# Patient Record
Sex: Female | Born: 1940 | Race: White | Hispanic: No | Marital: Married | Smoking: Former smoker
Health system: Southern US, Community
[De-identification: ages and names within clinical notes are randomized; demographics above are authoritative.]

## PROBLEM LIST (undated history)

## (undated) DIAGNOSIS — E119 Type 2 diabetes mellitus without complications: Secondary | ICD-10-CM

## (undated) DIAGNOSIS — R42 Dizziness and giddiness: Secondary | ICD-10-CM

## (undated) DIAGNOSIS — I10 Essential (primary) hypertension: Secondary | ICD-10-CM

## (undated) DIAGNOSIS — K219 Gastro-esophageal reflux disease without esophagitis: Secondary | ICD-10-CM

## (undated) DIAGNOSIS — C801 Malignant (primary) neoplasm, unspecified: Secondary | ICD-10-CM

## (undated) DIAGNOSIS — E039 Hypothyroidism, unspecified: Secondary | ICD-10-CM

---

## 1981-06-22 HISTORY — PX: REDUCTION MAMMAPLASTY: SUR839

## 2004-08-05 ENCOUNTER — Inpatient Hospital Stay: Payer: Self-pay | Admitting: Internal Medicine

## 2005-05-05 ENCOUNTER — Ambulatory Visit: Payer: Self-pay | Admitting: Internal Medicine

## 2006-05-11 ENCOUNTER — Ambulatory Visit: Payer: Self-pay | Admitting: Internal Medicine

## 2007-05-18 ENCOUNTER — Ambulatory Visit: Payer: Self-pay | Admitting: Internal Medicine

## 2007-07-18 ENCOUNTER — Ambulatory Visit: Payer: Self-pay | Admitting: Gastroenterology

## 2008-02-09 ENCOUNTER — Ambulatory Visit: Payer: Self-pay | Admitting: Gastroenterology

## 2008-04-16 ENCOUNTER — Ambulatory Visit: Payer: Self-pay | Admitting: Gastroenterology

## 2008-05-22 ENCOUNTER — Ambulatory Visit: Payer: Self-pay | Admitting: Internal Medicine

## 2009-05-31 ENCOUNTER — Ambulatory Visit: Payer: Self-pay | Admitting: Internal Medicine

## 2010-06-11 ENCOUNTER — Ambulatory Visit: Payer: Self-pay | Admitting: Internal Medicine

## 2011-07-07 ENCOUNTER — Ambulatory Visit: Payer: Self-pay | Admitting: Internal Medicine

## 2011-12-03 ENCOUNTER — Ambulatory Visit: Payer: Self-pay

## 2012-08-09 ENCOUNTER — Ambulatory Visit: Payer: Self-pay | Admitting: Internal Medicine

## 2013-08-10 ENCOUNTER — Ambulatory Visit: Payer: Self-pay | Admitting: Internal Medicine

## 2013-08-20 ENCOUNTER — Emergency Department: Payer: Self-pay | Admitting: Emergency Medicine

## 2014-06-27 DIAGNOSIS — M4806 Spinal stenosis, lumbar region: Secondary | ICD-10-CM | POA: Diagnosis not present

## 2014-06-27 DIAGNOSIS — M5416 Radiculopathy, lumbar region: Secondary | ICD-10-CM | POA: Diagnosis not present

## 2014-07-09 DIAGNOSIS — H40053 Ocular hypertension, bilateral: Secondary | ICD-10-CM | POA: Diagnosis not present

## 2014-07-30 DIAGNOSIS — M4806 Spinal stenosis, lumbar region: Secondary | ICD-10-CM | POA: Diagnosis not present

## 2014-07-30 DIAGNOSIS — M5416 Radiculopathy, lumbar region: Secondary | ICD-10-CM | POA: Diagnosis not present

## 2014-08-13 ENCOUNTER — Ambulatory Visit: Payer: Self-pay | Admitting: Internal Medicine

## 2014-08-13 DIAGNOSIS — Z1231 Encounter for screening mammogram for malignant neoplasm of breast: Secondary | ICD-10-CM | POA: Diagnosis not present

## 2014-09-07 DIAGNOSIS — M545 Low back pain: Secondary | ICD-10-CM | POA: Diagnosis not present

## 2014-09-07 DIAGNOSIS — F329 Major depressive disorder, single episode, unspecified: Secondary | ICD-10-CM | POA: Diagnosis not present

## 2014-09-07 DIAGNOSIS — R739 Hyperglycemia, unspecified: Secondary | ICD-10-CM | POA: Diagnosis not present

## 2014-09-07 DIAGNOSIS — E785 Hyperlipidemia, unspecified: Secondary | ICD-10-CM | POA: Diagnosis not present

## 2014-09-12 DIAGNOSIS — D2271 Melanocytic nevi of right lower limb, including hip: Secondary | ICD-10-CM | POA: Diagnosis not present

## 2014-09-12 DIAGNOSIS — Z872 Personal history of diseases of the skin and subcutaneous tissue: Secondary | ICD-10-CM | POA: Diagnosis not present

## 2014-09-12 DIAGNOSIS — Z1283 Encounter for screening for malignant neoplasm of skin: Secondary | ICD-10-CM | POA: Diagnosis not present

## 2014-09-12 DIAGNOSIS — D485 Neoplasm of uncertain behavior of skin: Secondary | ICD-10-CM | POA: Diagnosis not present

## 2014-09-12 DIAGNOSIS — D2221 Melanocytic nevi of right ear and external auricular canal: Secondary | ICD-10-CM | POA: Diagnosis not present

## 2014-09-12 DIAGNOSIS — L718 Other rosacea: Secondary | ICD-10-CM | POA: Diagnosis not present

## 2014-09-26 DIAGNOSIS — E784 Other hyperlipidemia: Secondary | ICD-10-CM | POA: Diagnosis not present

## 2014-09-26 DIAGNOSIS — I10 Essential (primary) hypertension: Secondary | ICD-10-CM | POA: Diagnosis not present

## 2014-09-26 DIAGNOSIS — Z0001 Encounter for general adult medical examination with abnormal findings: Secondary | ICD-10-CM | POA: Diagnosis not present

## 2014-09-26 DIAGNOSIS — K219 Gastro-esophageal reflux disease without esophagitis: Secondary | ICD-10-CM | POA: Diagnosis not present

## 2014-09-26 DIAGNOSIS — R739 Hyperglycemia, unspecified: Secondary | ICD-10-CM | POA: Diagnosis not present

## 2014-09-27 DIAGNOSIS — H04563 Stenosis of bilateral lacrimal punctum: Secondary | ICD-10-CM | POA: Diagnosis not present

## 2014-10-16 DIAGNOSIS — M898X9 Other specified disorders of bone, unspecified site: Secondary | ICD-10-CM | POA: Diagnosis not present

## 2014-10-16 DIAGNOSIS — M79674 Pain in right toe(s): Secondary | ICD-10-CM | POA: Diagnosis not present

## 2014-12-13 DIAGNOSIS — H04563 Stenosis of bilateral lacrimal punctum: Secondary | ICD-10-CM | POA: Diagnosis not present

## 2014-12-21 DIAGNOSIS — I10 Essential (primary) hypertension: Secondary | ICD-10-CM | POA: Diagnosis not present

## 2014-12-21 DIAGNOSIS — K219 Gastro-esophageal reflux disease without esophagitis: Secondary | ICD-10-CM | POA: Diagnosis not present

## 2014-12-21 DIAGNOSIS — R739 Hyperglycemia, unspecified: Secondary | ICD-10-CM | POA: Diagnosis not present

## 2014-12-28 DIAGNOSIS — K219 Gastro-esophageal reflux disease without esophagitis: Secondary | ICD-10-CM | POA: Diagnosis not present

## 2014-12-28 DIAGNOSIS — E038 Other specified hypothyroidism: Secondary | ICD-10-CM | POA: Diagnosis not present

## 2014-12-28 DIAGNOSIS — R739 Hyperglycemia, unspecified: Secondary | ICD-10-CM | POA: Diagnosis not present

## 2014-12-28 DIAGNOSIS — E784 Other hyperlipidemia: Secondary | ICD-10-CM | POA: Diagnosis not present

## 2015-03-27 DIAGNOSIS — E119 Type 2 diabetes mellitus without complications: Secondary | ICD-10-CM | POA: Diagnosis not present

## 2015-03-27 DIAGNOSIS — M545 Low back pain: Secondary | ICD-10-CM | POA: Diagnosis not present

## 2015-03-27 DIAGNOSIS — G8929 Other chronic pain: Secondary | ICD-10-CM | POA: Diagnosis not present

## 2015-03-27 DIAGNOSIS — E034 Atrophy of thyroid (acquired): Secondary | ICD-10-CM | POA: Diagnosis not present

## 2015-03-29 DIAGNOSIS — G8929 Other chronic pain: Secondary | ICD-10-CM | POA: Diagnosis not present

## 2015-03-29 DIAGNOSIS — E034 Atrophy of thyroid (acquired): Secondary | ICD-10-CM | POA: Diagnosis not present

## 2015-03-29 DIAGNOSIS — E119 Type 2 diabetes mellitus without complications: Secondary | ICD-10-CM | POA: Diagnosis not present

## 2015-03-29 DIAGNOSIS — M545 Low back pain: Secondary | ICD-10-CM | POA: Diagnosis not present

## 2015-04-03 DIAGNOSIS — K219 Gastro-esophageal reflux disease without esophagitis: Secondary | ICD-10-CM | POA: Diagnosis not present

## 2015-04-03 DIAGNOSIS — Z23 Encounter for immunization: Secondary | ICD-10-CM | POA: Diagnosis not present

## 2015-04-03 DIAGNOSIS — E119 Type 2 diabetes mellitus without complications: Secondary | ICD-10-CM | POA: Diagnosis not present

## 2015-04-03 DIAGNOSIS — E784 Other hyperlipidemia: Secondary | ICD-10-CM | POA: Diagnosis not present

## 2015-04-03 DIAGNOSIS — F3342 Major depressive disorder, recurrent, in full remission: Secondary | ICD-10-CM | POA: Diagnosis not present

## 2015-04-03 DIAGNOSIS — E034 Atrophy of thyroid (acquired): Secondary | ICD-10-CM | POA: Diagnosis not present

## 2015-04-03 DIAGNOSIS — I10 Essential (primary) hypertension: Secondary | ICD-10-CM | POA: Diagnosis not present

## 2015-04-03 DIAGNOSIS — M545 Low back pain: Secondary | ICD-10-CM | POA: Diagnosis not present

## 2015-04-10 DIAGNOSIS — L570 Actinic keratosis: Secondary | ICD-10-CM | POA: Diagnosis not present

## 2015-04-10 DIAGNOSIS — L821 Other seborrheic keratosis: Secondary | ICD-10-CM | POA: Diagnosis not present

## 2015-04-11 DIAGNOSIS — H04563 Stenosis of bilateral lacrimal punctum: Secondary | ICD-10-CM | POA: Diagnosis not present

## 2015-05-22 DIAGNOSIS — M4806 Spinal stenosis, lumbar region: Secondary | ICD-10-CM | POA: Diagnosis not present

## 2015-05-22 DIAGNOSIS — M5416 Radiculopathy, lumbar region: Secondary | ICD-10-CM | POA: Diagnosis not present

## 2015-06-04 DIAGNOSIS — M4806 Spinal stenosis, lumbar region: Secondary | ICD-10-CM | POA: Diagnosis not present

## 2015-06-04 DIAGNOSIS — M5416 Radiculopathy, lumbar region: Secondary | ICD-10-CM | POA: Diagnosis not present

## 2015-09-16 DIAGNOSIS — I10 Essential (primary) hypertension: Secondary | ICD-10-CM | POA: Diagnosis not present

## 2015-09-16 DIAGNOSIS — E119 Type 2 diabetes mellitus without complications: Secondary | ICD-10-CM | POA: Diagnosis not present

## 2015-09-16 DIAGNOSIS — R42 Dizziness and giddiness: Secondary | ICD-10-CM | POA: Diagnosis not present

## 2015-09-16 DIAGNOSIS — M4806 Spinal stenosis, lumbar region: Secondary | ICD-10-CM | POA: Diagnosis not present

## 2015-09-17 ENCOUNTER — Other Ambulatory Visit: Payer: Self-pay | Admitting: Internal Medicine

## 2015-09-17 DIAGNOSIS — R42 Dizziness and giddiness: Secondary | ICD-10-CM

## 2015-09-20 ENCOUNTER — Ambulatory Visit: Payer: Self-pay

## 2015-09-25 ENCOUNTER — Ambulatory Visit
Admission: RE | Admit: 2015-09-25 | Discharge: 2015-09-25 | Disposition: A | Discharge status: Home / Self Care | Payer: Commercial Managed Care - HMO | Source: Ambulatory Visit | Attending: Internal Medicine | Admitting: Internal Medicine

## 2015-09-25 ENCOUNTER — Ambulatory Visit: Payer: Self-pay

## 2015-09-25 DIAGNOSIS — R42 Dizziness and giddiness: Secondary | ICD-10-CM | POA: Insufficient documentation

## 2015-09-25 DIAGNOSIS — I10 Essential (primary) hypertension: Secondary | ICD-10-CM | POA: Diagnosis not present

## 2015-09-26 DIAGNOSIS — M545 Low back pain: Secondary | ICD-10-CM | POA: Diagnosis not present

## 2015-09-26 DIAGNOSIS — I10 Essential (primary) hypertension: Secondary | ICD-10-CM | POA: Diagnosis not present

## 2015-09-26 DIAGNOSIS — G8929 Other chronic pain: Secondary | ICD-10-CM | POA: Diagnosis not present

## 2015-09-26 DIAGNOSIS — E784 Other hyperlipidemia: Secondary | ICD-10-CM | POA: Diagnosis not present

## 2015-09-26 DIAGNOSIS — E119 Type 2 diabetes mellitus without complications: Secondary | ICD-10-CM | POA: Diagnosis not present

## 2015-09-26 DIAGNOSIS — K219 Gastro-esophageal reflux disease without esophagitis: Secondary | ICD-10-CM | POA: Diagnosis not present

## 2015-10-11 DIAGNOSIS — D329 Benign neoplasm of meninges, unspecified: Secondary | ICD-10-CM | POA: Diagnosis not present

## 2015-10-11 DIAGNOSIS — E034 Atrophy of thyroid (acquired): Secondary | ICD-10-CM | POA: Diagnosis not present

## 2015-10-11 DIAGNOSIS — E119 Type 2 diabetes mellitus without complications: Secondary | ICD-10-CM | POA: Diagnosis not present

## 2015-10-11 DIAGNOSIS — K219 Gastro-esophageal reflux disease without esophagitis: Secondary | ICD-10-CM | POA: Diagnosis not present

## 2015-10-11 DIAGNOSIS — Z1239 Encounter for other screening for malignant neoplasm of breast: Secondary | ICD-10-CM | POA: Diagnosis not present

## 2015-10-11 DIAGNOSIS — I10 Essential (primary) hypertension: Secondary | ICD-10-CM | POA: Diagnosis not present

## 2015-10-11 DIAGNOSIS — E784 Other hyperlipidemia: Secondary | ICD-10-CM | POA: Diagnosis not present

## 2015-10-11 DIAGNOSIS — M545 Low back pain: Secondary | ICD-10-CM | POA: Diagnosis not present

## 2015-10-11 DIAGNOSIS — F3342 Major depressive disorder, recurrent, in full remission: Secondary | ICD-10-CM | POA: Diagnosis not present

## 2015-10-11 DIAGNOSIS — Z0000 Encounter for general adult medical examination without abnormal findings: Secondary | ICD-10-CM | POA: Diagnosis not present

## 2015-11-20 DIAGNOSIS — M4806 Spinal stenosis, lumbar region: Secondary | ICD-10-CM | POA: Diagnosis not present

## 2015-11-20 DIAGNOSIS — M5416 Radiculopathy, lumbar region: Secondary | ICD-10-CM | POA: Diagnosis not present

## 2015-11-25 DIAGNOSIS — H40053 Ocular hypertension, bilateral: Secondary | ICD-10-CM | POA: Diagnosis not present

## 2015-12-26 DIAGNOSIS — M5416 Radiculopathy, lumbar region: Secondary | ICD-10-CM | POA: Diagnosis not present

## 2015-12-26 DIAGNOSIS — M4806 Spinal stenosis, lumbar region: Secondary | ICD-10-CM | POA: Diagnosis not present

## 2016-01-14 ENCOUNTER — Other Ambulatory Visit: Payer: Self-pay | Admitting: Physical Medicine and Rehabilitation

## 2016-01-14 DIAGNOSIS — M5416 Radiculopathy, lumbar region: Secondary | ICD-10-CM

## 2016-01-24 ENCOUNTER — Ambulatory Visit
Admission: RE | Admit: 2016-01-24 | Discharge: 2016-01-24 | Disposition: A | Discharge status: Home / Self Care | Payer: Commercial Managed Care - HMO | Source: Ambulatory Visit | Attending: Physical Medicine and Rehabilitation | Admitting: Physical Medicine and Rehabilitation

## 2016-01-24 DIAGNOSIS — M5116 Intervertebral disc disorders with radiculopathy, lumbar region: Secondary | ICD-10-CM | POA: Insufficient documentation

## 2016-01-24 DIAGNOSIS — M5136 Other intervertebral disc degeneration, lumbar region: Secondary | ICD-10-CM | POA: Diagnosis not present

## 2016-01-24 DIAGNOSIS — M5416 Radiculopathy, lumbar region: Secondary | ICD-10-CM | POA: Diagnosis not present

## 2016-01-24 DIAGNOSIS — Z9889 Other specified postprocedural states: Secondary | ICD-10-CM | POA: Insufficient documentation

## 2016-01-28 DIAGNOSIS — M5136 Other intervertebral disc degeneration, lumbar region: Secondary | ICD-10-CM | POA: Diagnosis not present

## 2016-01-28 DIAGNOSIS — M5416 Radiculopathy, lumbar region: Secondary | ICD-10-CM | POA: Diagnosis not present

## 2016-01-28 DIAGNOSIS — M4806 Spinal stenosis, lumbar region: Secondary | ICD-10-CM | POA: Diagnosis not present

## 2016-02-07 DIAGNOSIS — M5136 Other intervertebral disc degeneration, lumbar region: Secondary | ICD-10-CM | POA: Diagnosis not present

## 2016-02-07 DIAGNOSIS — M5416 Radiculopathy, lumbar region: Secondary | ICD-10-CM | POA: Diagnosis not present

## 2016-02-07 DIAGNOSIS — M4806 Spinal stenosis, lumbar region: Secondary | ICD-10-CM | POA: Diagnosis not present

## 2016-04-07 DIAGNOSIS — K219 Gastro-esophageal reflux disease without esophagitis: Secondary | ICD-10-CM | POA: Diagnosis not present

## 2016-04-07 DIAGNOSIS — D329 Benign neoplasm of meninges, unspecified: Secondary | ICD-10-CM | POA: Diagnosis not present

## 2016-04-07 DIAGNOSIS — I10 Essential (primary) hypertension: Secondary | ICD-10-CM | POA: Diagnosis not present

## 2016-04-07 DIAGNOSIS — E119 Type 2 diabetes mellitus without complications: Secondary | ICD-10-CM | POA: Diagnosis not present

## 2016-04-14 DIAGNOSIS — M545 Low back pain: Secondary | ICD-10-CM | POA: Diagnosis not present

## 2016-04-14 DIAGNOSIS — E119 Type 2 diabetes mellitus without complications: Secondary | ICD-10-CM | POA: Diagnosis not present

## 2016-04-14 DIAGNOSIS — E034 Atrophy of thyroid (acquired): Secondary | ICD-10-CM | POA: Diagnosis not present

## 2016-04-14 DIAGNOSIS — D329 Benign neoplasm of meninges, unspecified: Secondary | ICD-10-CM | POA: Diagnosis not present

## 2016-04-14 DIAGNOSIS — E784 Other hyperlipidemia: Secondary | ICD-10-CM | POA: Diagnosis not present

## 2016-04-14 DIAGNOSIS — I10 Essential (primary) hypertension: Secondary | ICD-10-CM | POA: Diagnosis not present

## 2016-04-14 DIAGNOSIS — F3342 Major depressive disorder, recurrent, in full remission: Secondary | ICD-10-CM | POA: Diagnosis not present

## 2016-04-14 DIAGNOSIS — Z23 Encounter for immunization: Secondary | ICD-10-CM | POA: Diagnosis not present

## 2016-04-14 DIAGNOSIS — K219 Gastro-esophageal reflux disease without esophagitis: Secondary | ICD-10-CM | POA: Diagnosis not present

## 2016-05-01 DIAGNOSIS — M5416 Radiculopathy, lumbar region: Secondary | ICD-10-CM | POA: Diagnosis not present

## 2016-05-01 DIAGNOSIS — M5136 Other intervertebral disc degeneration, lumbar region: Secondary | ICD-10-CM | POA: Diagnosis not present

## 2016-05-01 DIAGNOSIS — M48062 Spinal stenosis, lumbar region with neurogenic claudication: Secondary | ICD-10-CM | POA: Diagnosis not present

## 2016-05-12 DIAGNOSIS — H10523 Angular blepharoconjunctivitis, bilateral: Secondary | ICD-10-CM | POA: Diagnosis not present

## 2016-07-07 DIAGNOSIS — H40002 Preglaucoma, unspecified, left eye: Secondary | ICD-10-CM | POA: Diagnosis not present

## 2016-07-14 ENCOUNTER — Other Ambulatory Visit: Payer: Self-pay | Admitting: Internal Medicine

## 2016-07-14 DIAGNOSIS — H40053 Ocular hypertension, bilateral: Secondary | ICD-10-CM | POA: Diagnosis not present

## 2016-07-14 DIAGNOSIS — Z1231 Encounter for screening mammogram for malignant neoplasm of breast: Secondary | ICD-10-CM

## 2016-08-13 ENCOUNTER — Encounter (HOSPITAL_COMMUNITY): Payer: Self-pay

## 2016-08-13 ENCOUNTER — Ambulatory Visit
Admission: RE | Admit: 2016-08-13 | Discharge: 2016-08-13 | Disposition: A | Discharge status: Home / Self Care | Payer: Commercial Managed Care - HMO | Source: Ambulatory Visit | Attending: Internal Medicine | Admitting: Internal Medicine

## 2016-08-13 DIAGNOSIS — Z1231 Encounter for screening mammogram for malignant neoplasm of breast: Secondary | ICD-10-CM | POA: Diagnosis not present

## 2016-08-13 HISTORY — DX: Malignant (primary) neoplasm, unspecified: C80.1

## 2016-09-16 ENCOUNTER — Other Ambulatory Visit: Payer: Self-pay | Admitting: Internal Medicine

## 2016-09-16 DIAGNOSIS — R42 Dizziness and giddiness: Secondary | ICD-10-CM

## 2016-10-05 ENCOUNTER — Ambulatory Visit
Admission: RE | Admit: 2016-10-05 | Discharge: 2016-10-05 | Disposition: A | Discharge status: Home / Self Care | Payer: Medicare HMO | Source: Ambulatory Visit | Attending: Internal Medicine | Admitting: Internal Medicine

## 2016-10-05 DIAGNOSIS — K219 Gastro-esophageal reflux disease without esophagitis: Secondary | ICD-10-CM | POA: Diagnosis not present

## 2016-10-05 DIAGNOSIS — R42 Dizziness and giddiness: Secondary | ICD-10-CM | POA: Insufficient documentation

## 2016-10-05 DIAGNOSIS — E119 Type 2 diabetes mellitus without complications: Secondary | ICD-10-CM | POA: Diagnosis not present

## 2016-10-05 DIAGNOSIS — G8929 Other chronic pain: Secondary | ICD-10-CM | POA: Diagnosis not present

## 2016-10-05 DIAGNOSIS — D320 Benign neoplasm of cerebral meninges: Secondary | ICD-10-CM | POA: Diagnosis not present

## 2016-10-05 DIAGNOSIS — I10 Essential (primary) hypertension: Secondary | ICD-10-CM | POA: Diagnosis not present

## 2016-10-05 DIAGNOSIS — D329 Benign neoplasm of meninges, unspecified: Secondary | ICD-10-CM | POA: Diagnosis not present

## 2016-10-05 DIAGNOSIS — M545 Low back pain: Secondary | ICD-10-CM | POA: Diagnosis not present

## 2016-10-12 DIAGNOSIS — E784 Other hyperlipidemia: Secondary | ICD-10-CM | POA: Diagnosis not present

## 2016-10-12 DIAGNOSIS — D1803 Hemangioma of intra-abdominal structures: Secondary | ICD-10-CM | POA: Diagnosis not present

## 2016-10-12 DIAGNOSIS — Z0000 Encounter for general adult medical examination without abnormal findings: Secondary | ICD-10-CM | POA: Diagnosis not present

## 2016-10-12 DIAGNOSIS — K219 Gastro-esophageal reflux disease without esophagitis: Secondary | ICD-10-CM | POA: Diagnosis not present

## 2016-10-12 DIAGNOSIS — M5416 Radiculopathy, lumbar region: Secondary | ICD-10-CM | POA: Diagnosis not present

## 2016-10-12 DIAGNOSIS — M48062 Spinal stenosis, lumbar region with neurogenic claudication: Secondary | ICD-10-CM | POA: Diagnosis not present

## 2016-10-12 DIAGNOSIS — E034 Atrophy of thyroid (acquired): Secondary | ICD-10-CM | POA: Diagnosis not present

## 2016-10-12 DIAGNOSIS — I10 Essential (primary) hypertension: Secondary | ICD-10-CM | POA: Diagnosis not present

## 2016-10-12 DIAGNOSIS — E119 Type 2 diabetes mellitus without complications: Secondary | ICD-10-CM | POA: Diagnosis not present

## 2016-10-12 DIAGNOSIS — D329 Benign neoplasm of meninges, unspecified: Secondary | ICD-10-CM | POA: Diagnosis not present

## 2016-11-03 DIAGNOSIS — R079 Chest pain, unspecified: Secondary | ICD-10-CM | POA: Diagnosis not present

## 2016-11-27 DIAGNOSIS — M48062 Spinal stenosis, lumbar region with neurogenic claudication: Secondary | ICD-10-CM | POA: Diagnosis not present

## 2016-11-27 DIAGNOSIS — M5416 Radiculopathy, lumbar region: Secondary | ICD-10-CM | POA: Diagnosis not present

## 2016-11-27 DIAGNOSIS — M5136 Other intervertebral disc degeneration, lumbar region: Secondary | ICD-10-CM | POA: Diagnosis not present

## 2017-01-05 DIAGNOSIS — H40002 Preglaucoma, unspecified, left eye: Secondary | ICD-10-CM | POA: Diagnosis not present

## 2017-01-12 DIAGNOSIS — H40003 Preglaucoma, unspecified, bilateral: Secondary | ICD-10-CM | POA: Diagnosis not present

## 2017-01-20 DIAGNOSIS — M5136 Other intervertebral disc degeneration, lumbar region: Secondary | ICD-10-CM | POA: Diagnosis not present

## 2017-01-20 DIAGNOSIS — M5416 Radiculopathy, lumbar region: Secondary | ICD-10-CM | POA: Diagnosis not present

## 2017-01-20 DIAGNOSIS — M48062 Spinal stenosis, lumbar region with neurogenic claudication: Secondary | ICD-10-CM | POA: Diagnosis not present

## 2017-03-31 DIAGNOSIS — M79674 Pain in right toe(s): Secondary | ICD-10-CM | POA: Diagnosis not present

## 2017-03-31 DIAGNOSIS — M898X9 Other specified disorders of bone, unspecified site: Secondary | ICD-10-CM | POA: Diagnosis not present

## 2017-04-08 DIAGNOSIS — D1803 Hemangioma of intra-abdominal structures: Secondary | ICD-10-CM | POA: Diagnosis not present

## 2017-04-08 DIAGNOSIS — I10 Essential (primary) hypertension: Secondary | ICD-10-CM | POA: Diagnosis not present

## 2017-04-08 DIAGNOSIS — E119 Type 2 diabetes mellitus without complications: Secondary | ICD-10-CM | POA: Diagnosis not present

## 2017-04-08 DIAGNOSIS — K219 Gastro-esophageal reflux disease without esophagitis: Secondary | ICD-10-CM | POA: Diagnosis not present

## 2017-04-15 DIAGNOSIS — D1803 Hemangioma of intra-abdominal structures: Secondary | ICD-10-CM | POA: Diagnosis not present

## 2017-04-15 DIAGNOSIS — K219 Gastro-esophageal reflux disease without esophagitis: Secondary | ICD-10-CM | POA: Diagnosis not present

## 2017-04-15 DIAGNOSIS — E119 Type 2 diabetes mellitus without complications: Secondary | ICD-10-CM | POA: Diagnosis not present

## 2017-04-15 DIAGNOSIS — M5416 Radiculopathy, lumbar region: Secondary | ICD-10-CM | POA: Diagnosis not present

## 2017-04-15 DIAGNOSIS — E7849 Other hyperlipidemia: Secondary | ICD-10-CM | POA: Diagnosis not present

## 2017-04-15 DIAGNOSIS — I10 Essential (primary) hypertension: Secondary | ICD-10-CM | POA: Diagnosis not present

## 2017-04-15 DIAGNOSIS — D329 Benign neoplasm of meninges, unspecified: Secondary | ICD-10-CM | POA: Diagnosis not present

## 2017-04-15 DIAGNOSIS — Z23 Encounter for immunization: Secondary | ICD-10-CM | POA: Diagnosis not present

## 2017-04-15 DIAGNOSIS — E034 Atrophy of thyroid (acquired): Secondary | ICD-10-CM | POA: Diagnosis not present

## 2017-04-22 DIAGNOSIS — M48062 Spinal stenosis, lumbar region with neurogenic claudication: Secondary | ICD-10-CM | POA: Diagnosis not present

## 2017-04-22 DIAGNOSIS — M5416 Radiculopathy, lumbar region: Secondary | ICD-10-CM | POA: Diagnosis not present

## 2017-04-22 DIAGNOSIS — M5136 Other intervertebral disc degeneration, lumbar region: Secondary | ICD-10-CM | POA: Diagnosis not present

## 2017-06-24 DIAGNOSIS — I781 Nevus, non-neoplastic: Secondary | ICD-10-CM | POA: Diagnosis not present

## 2017-06-24 DIAGNOSIS — Z86018 Personal history of other benign neoplasm: Secondary | ICD-10-CM | POA: Diagnosis not present

## 2017-06-24 DIAGNOSIS — L821 Other seborrheic keratosis: Secondary | ICD-10-CM | POA: Diagnosis not present

## 2017-06-24 DIAGNOSIS — Z872 Personal history of diseases of the skin and subcutaneous tissue: Secondary | ICD-10-CM | POA: Diagnosis not present

## 2017-06-24 DIAGNOSIS — Z1283 Encounter for screening for malignant neoplasm of skin: Secondary | ICD-10-CM | POA: Diagnosis not present

## 2017-06-24 DIAGNOSIS — L570 Actinic keratosis: Secondary | ICD-10-CM | POA: Diagnosis not present

## 2017-06-24 DIAGNOSIS — L578 Other skin changes due to chronic exposure to nonionizing radiation: Secondary | ICD-10-CM | POA: Diagnosis not present

## 2017-06-24 DIAGNOSIS — Z85828 Personal history of other malignant neoplasm of skin: Secondary | ICD-10-CM | POA: Diagnosis not present

## 2017-07-14 DIAGNOSIS — M5136 Other intervertebral disc degeneration, lumbar region: Secondary | ICD-10-CM | POA: Diagnosis not present

## 2017-07-14 DIAGNOSIS — M5416 Radiculopathy, lumbar region: Secondary | ICD-10-CM | POA: Diagnosis not present

## 2017-07-14 DIAGNOSIS — M48062 Spinal stenosis, lumbar region with neurogenic claudication: Secondary | ICD-10-CM | POA: Diagnosis not present

## 2017-08-03 ENCOUNTER — Other Ambulatory Visit: Payer: Self-pay | Admitting: Internal Medicine

## 2017-08-03 DIAGNOSIS — Z1231 Encounter for screening mammogram for malignant neoplasm of breast: Secondary | ICD-10-CM

## 2017-08-05 DIAGNOSIS — E119 Type 2 diabetes mellitus without complications: Secondary | ICD-10-CM | POA: Diagnosis not present

## 2017-08-20 ENCOUNTER — Ambulatory Visit
Admission: RE | Admit: 2017-08-20 | Discharge: 2017-08-20 | Disposition: A | Discharge status: Home / Self Care | Payer: Medicare HMO | Source: Ambulatory Visit | Attending: Internal Medicine | Admitting: Internal Medicine

## 2017-08-20 DIAGNOSIS — Z1231 Encounter for screening mammogram for malignant neoplasm of breast: Secondary | ICD-10-CM | POA: Diagnosis not present

## 2017-11-03 IMAGING — MR MR LUMBAR SPINE W/O CM
4 of 5 series · 15 of 48 positions shown · non-contrast
Comparison: Lumbar MRI 12/03/2011

CLINICAL DATA: Lumbar radiculitis.  Left leg pain and numbness

EXAM:
MRI LUMBAR SPINE WITHOUT CONTRAST
TECHNIQUE: Multiplanar, multisequence MR imaging of the lumbar spine was
performed. No intravenous contrast was administered.

[Series 3: T2 · sagittal · 4.0mm · 0.44mm/px · 6 of 17 slices shown (1 of 2)]
[im 1/17]
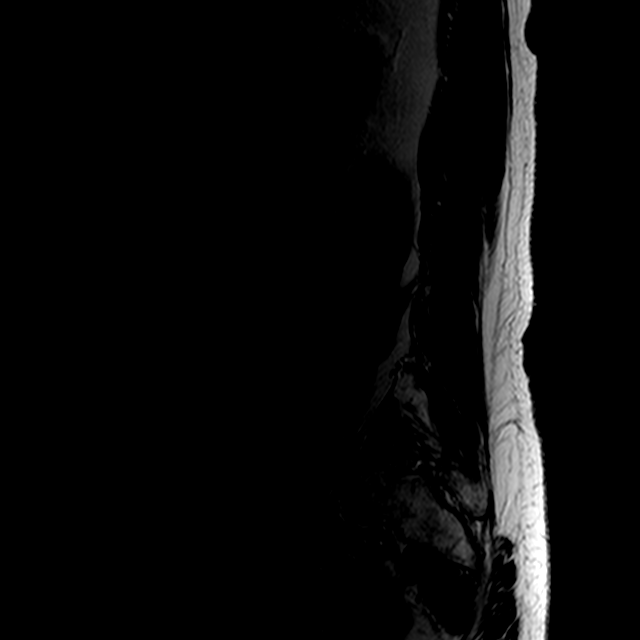
[im 4/17]
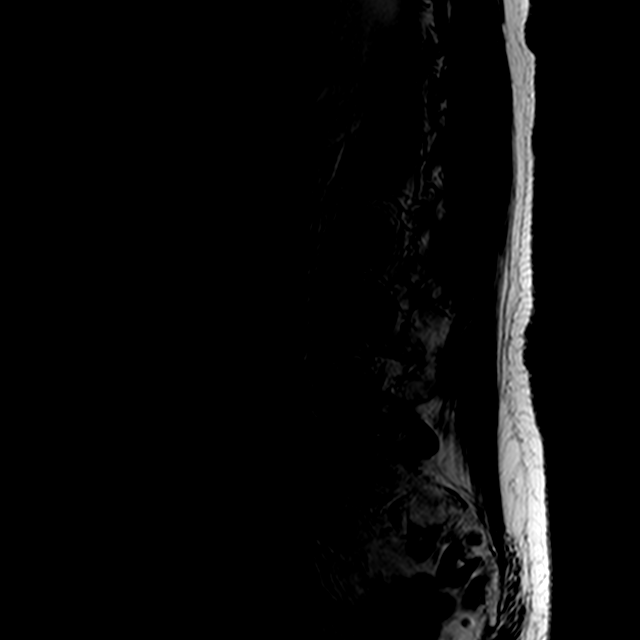
[im 7/17]
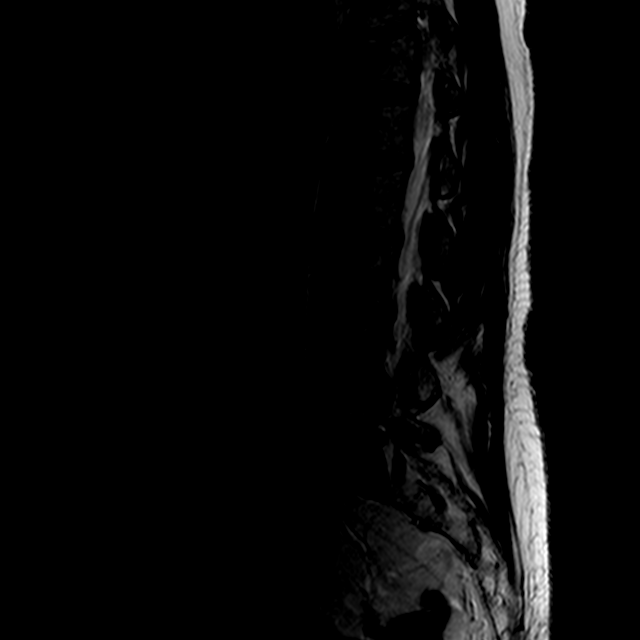
[im 10/17]
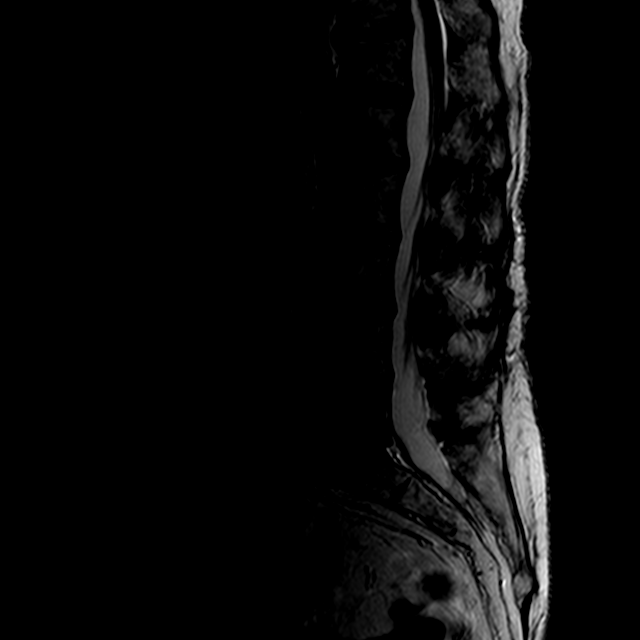
[im 13/17]
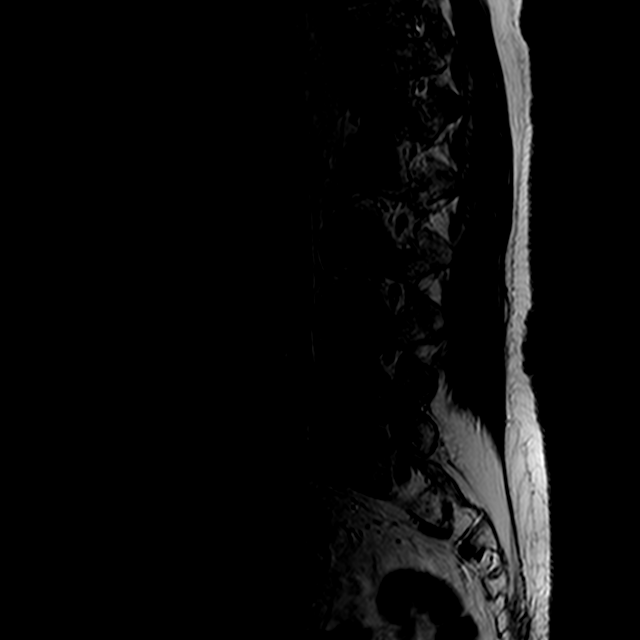
[im 17/17]
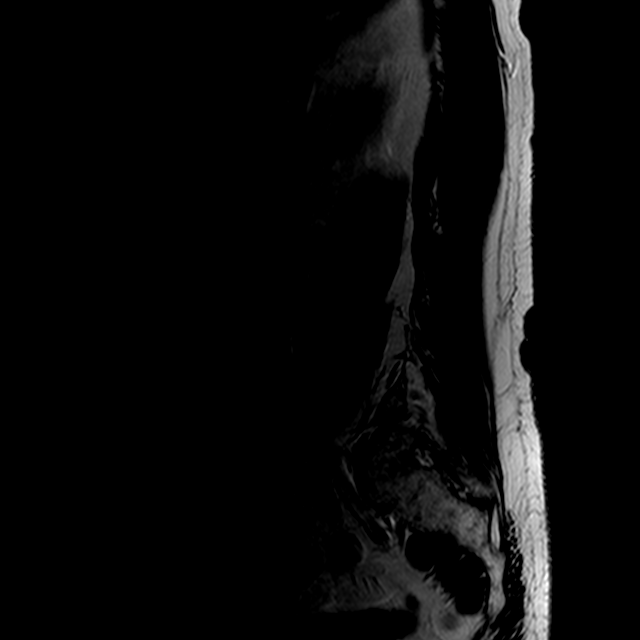

[Series 4: T1 · sagittal · 4.0mm · 0.44mm/px · 3 of 17 slices shown (1 of 2)]
[im 3/17]
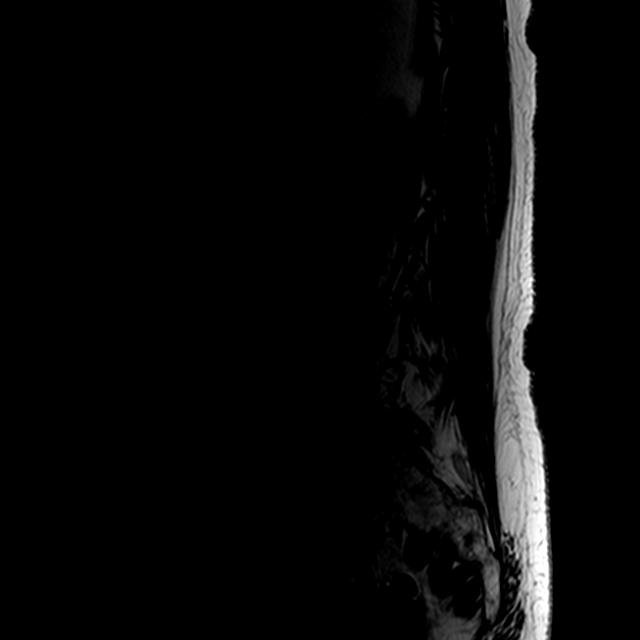
[im 9/17]
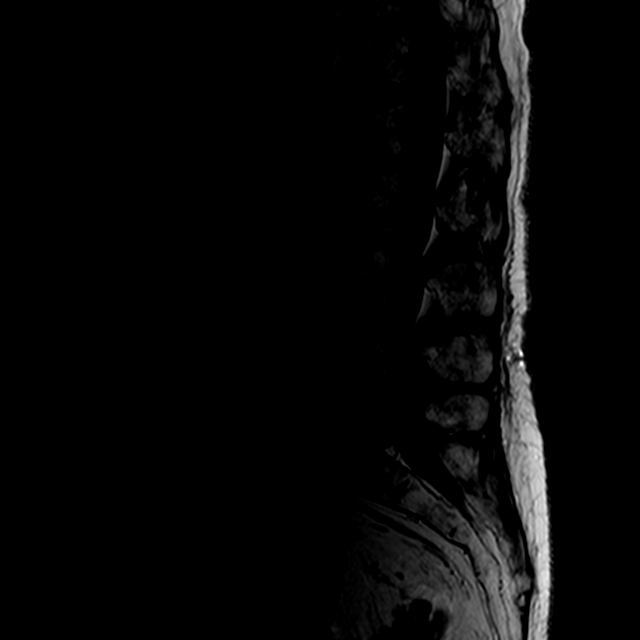
[im 14/17]
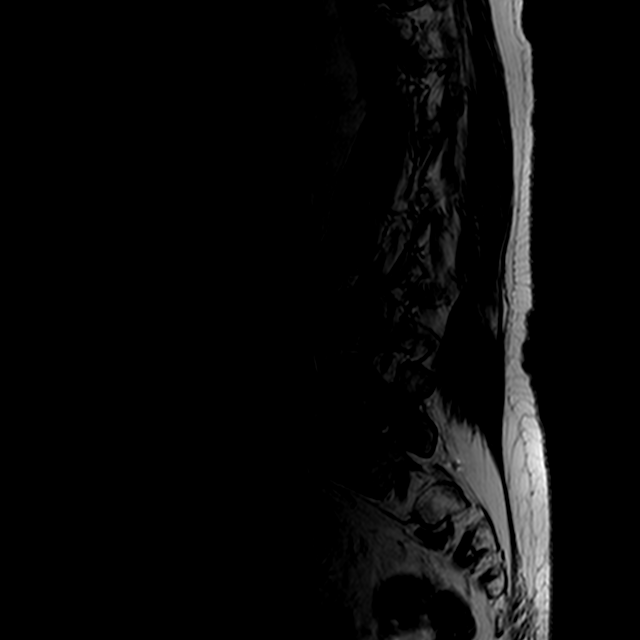

[Series 6: T2 · axial · 4.0mm · 0.39mm/px · z∈[-11,+122]mm · 3 of 36 slices shown (2 of 2)]
[im 6/36]
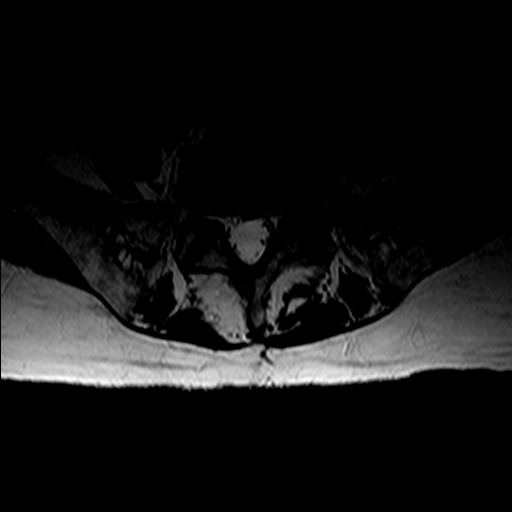
[im 19/36]
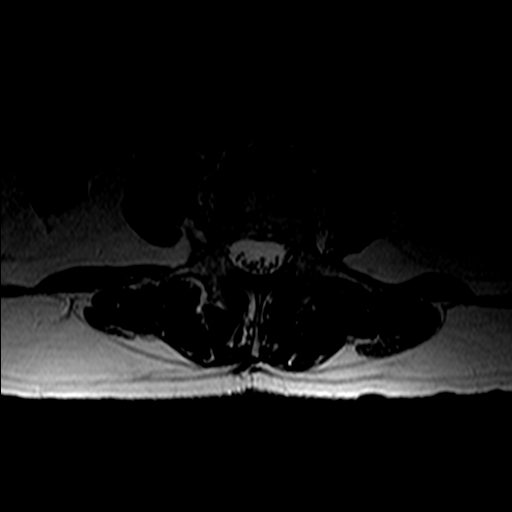
[im 30/36]
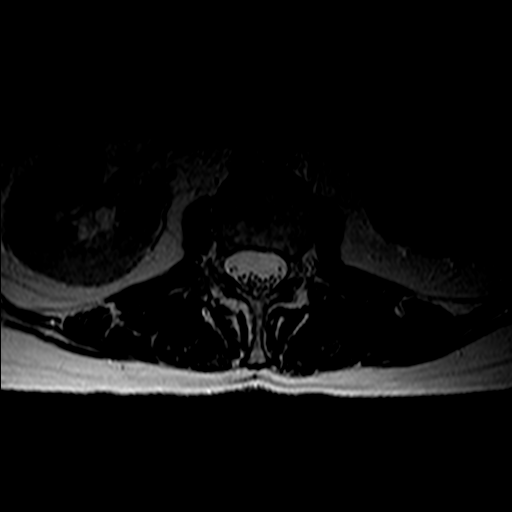

[Series 7: T1 · axial · 4.0mm · 0.39mm/px · z∈[-11,+122]mm · 3 of 36 slices shown (2 of 2)]
[im 6/36]
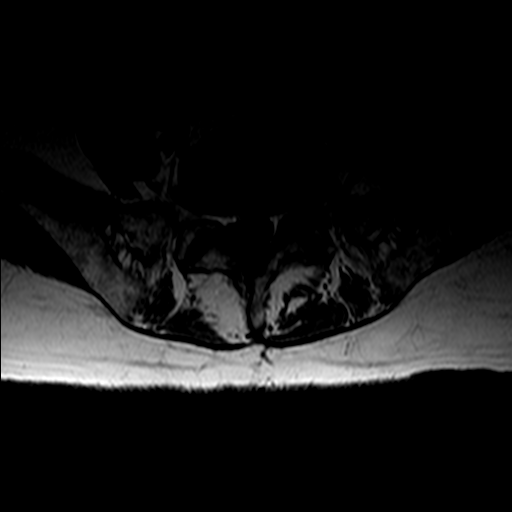
[im 19/36]
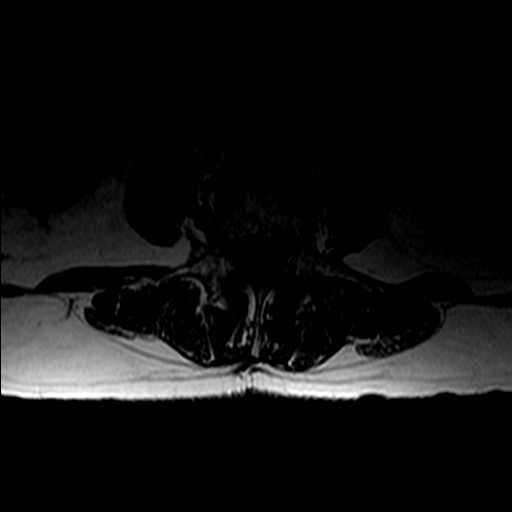
[im 30/36]
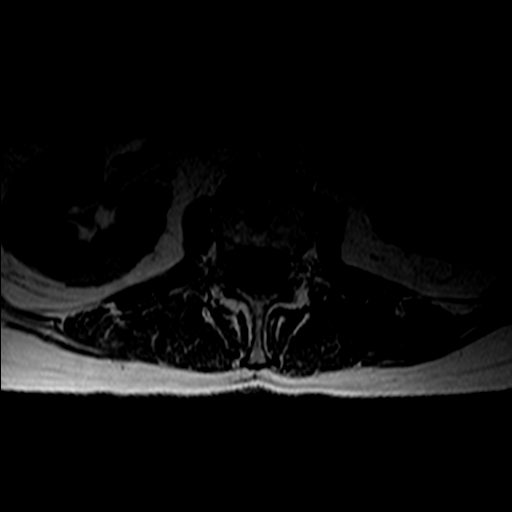

[15 of 48 positions shown; findings below may reference images not displayed]

FINDINGS: Segmentation:  Normal

Alignment:  Mild retrolisthesis L4-5 and L5-S1 unchanged.

Vertebrae: Negative for fracture or mass. Schmorl's node inferior
endplate L2 and L3 have progressed since the prior study. Discogenic
bone marrow edema at L5 has improved in the interval.

Conus medullaris: Extends to the T12-L1 level and appears normal.

Paraspinal and other soft tissues: Right hepatic cyst unchanged. No
retroperitoneal mass or adenopathy. No aortic aneurysm.

Disc levels:

T12-L1:  Mild disc degeneration

L1-2:  Mild disc degeneration

L2-3:  Mild disc and mild facet degeneration

L3-4: Mild disc and facet degeneration without significant spinal or
foraminal stenosis

L4-5: Mild retrolisthesis. Laminectomy on the right. Negative for
spinal or foraminal stenosis. Mild disc degeneration

L5-S1: Right laminectomy. Moderate disc degeneration with diffuse
endplate spurring unchanged. This is causing severe left foraminal
encroachment with compression of the left L5 nerve root. Moderate to
severe right foraminal encroachment also present and unchanged.
IMPRESSION: Right laminectomy L4-5 with mild disc degeneration.

Right laminectomy L5-S1. Diffuse endplate spurring causing marked
foraminal encroachment and L5 nerve root impingement left greater
than right.

## 2017-11-08 DIAGNOSIS — I10 Essential (primary) hypertension: Secondary | ICD-10-CM | POA: Diagnosis not present

## 2017-11-08 DIAGNOSIS — M48062 Spinal stenosis, lumbar region with neurogenic claudication: Secondary | ICD-10-CM | POA: Diagnosis not present

## 2017-11-08 DIAGNOSIS — K219 Gastro-esophageal reflux disease without esophagitis: Secondary | ICD-10-CM | POA: Diagnosis not present

## 2017-11-08 DIAGNOSIS — E119 Type 2 diabetes mellitus without complications: Secondary | ICD-10-CM | POA: Diagnosis not present

## 2017-11-08 DIAGNOSIS — E7849 Other hyperlipidemia: Secondary | ICD-10-CM | POA: Diagnosis not present

## 2017-11-11 DIAGNOSIS — K219 Gastro-esophageal reflux disease without esophagitis: Secondary | ICD-10-CM | POA: Diagnosis not present

## 2017-11-11 DIAGNOSIS — M5489 Other dorsalgia: Secondary | ICD-10-CM | POA: Diagnosis not present

## 2017-11-11 DIAGNOSIS — R0781 Pleurodynia: Secondary | ICD-10-CM | POA: Diagnosis not present

## 2017-11-11 DIAGNOSIS — M47816 Spondylosis without myelopathy or radiculopathy, lumbar region: Secondary | ICD-10-CM | POA: Diagnosis not present

## 2017-11-11 DIAGNOSIS — E034 Atrophy of thyroid (acquired): Secondary | ICD-10-CM | POA: Diagnosis not present

## 2017-11-11 DIAGNOSIS — M47814 Spondylosis without myelopathy or radiculopathy, thoracic region: Secondary | ICD-10-CM | POA: Diagnosis not present

## 2017-11-11 DIAGNOSIS — Z0000 Encounter for general adult medical examination without abnormal findings: Secondary | ICD-10-CM | POA: Diagnosis not present

## 2017-11-11 DIAGNOSIS — D329 Benign neoplasm of meninges, unspecified: Secondary | ICD-10-CM | POA: Diagnosis not present

## 2017-11-11 DIAGNOSIS — E119 Type 2 diabetes mellitus without complications: Secondary | ICD-10-CM | POA: Diagnosis not present

## 2017-11-11 DIAGNOSIS — I10 Essential (primary) hypertension: Secondary | ICD-10-CM | POA: Diagnosis not present

## 2018-02-01 DIAGNOSIS — H40002 Preglaucoma, unspecified, left eye: Secondary | ICD-10-CM | POA: Diagnosis not present

## 2018-02-08 DIAGNOSIS — H40002 Preglaucoma, unspecified, left eye: Secondary | ICD-10-CM | POA: Diagnosis not present

## 2018-02-08 DIAGNOSIS — E1136 Type 2 diabetes mellitus with diabetic cataract: Secondary | ICD-10-CM | POA: Diagnosis not present

## 2018-04-07 DIAGNOSIS — E7849 Other hyperlipidemia: Secondary | ICD-10-CM | POA: Diagnosis not present

## 2018-04-07 DIAGNOSIS — M48062 Spinal stenosis, lumbar region with neurogenic claudication: Secondary | ICD-10-CM | POA: Diagnosis not present

## 2018-04-07 DIAGNOSIS — K219 Gastro-esophageal reflux disease without esophagitis: Secondary | ICD-10-CM | POA: Diagnosis not present

## 2018-04-07 DIAGNOSIS — I10 Essential (primary) hypertension: Secondary | ICD-10-CM | POA: Diagnosis not present

## 2018-04-07 DIAGNOSIS — M47816 Spondylosis without myelopathy or radiculopathy, lumbar region: Secondary | ICD-10-CM | POA: Diagnosis not present

## 2018-04-07 DIAGNOSIS — E034 Atrophy of thyroid (acquired): Secondary | ICD-10-CM | POA: Diagnosis not present

## 2018-04-07 DIAGNOSIS — E119 Type 2 diabetes mellitus without complications: Secondary | ICD-10-CM | POA: Diagnosis not present

## 2018-04-14 ENCOUNTER — Other Ambulatory Visit: Payer: Self-pay | Admitting: Internal Medicine

## 2018-04-14 DIAGNOSIS — E119 Type 2 diabetes mellitus without complications: Secondary | ICD-10-CM | POA: Diagnosis not present

## 2018-04-14 DIAGNOSIS — M5416 Radiculopathy, lumbar region: Secondary | ICD-10-CM | POA: Diagnosis not present

## 2018-04-14 DIAGNOSIS — Z23 Encounter for immunization: Secondary | ICD-10-CM | POA: Diagnosis not present

## 2018-04-14 DIAGNOSIS — F3342 Major depressive disorder, recurrent, in full remission: Secondary | ICD-10-CM | POA: Diagnosis not present

## 2018-04-14 DIAGNOSIS — M48062 Spinal stenosis, lumbar region with neurogenic claudication: Secondary | ICD-10-CM | POA: Diagnosis not present

## 2018-04-14 DIAGNOSIS — E034 Atrophy of thyroid (acquired): Secondary | ICD-10-CM | POA: Diagnosis not present

## 2018-04-14 DIAGNOSIS — D329 Benign neoplasm of meninges, unspecified: Secondary | ICD-10-CM | POA: Diagnosis not present

## 2018-04-14 DIAGNOSIS — R519 Headache, unspecified: Secondary | ICD-10-CM

## 2018-04-14 DIAGNOSIS — R51 Headache: Principal | ICD-10-CM

## 2018-04-14 DIAGNOSIS — K219 Gastro-esophageal reflux disease without esophagitis: Secondary | ICD-10-CM | POA: Diagnosis not present

## 2018-04-14 DIAGNOSIS — I10 Essential (primary) hypertension: Secondary | ICD-10-CM | POA: Diagnosis not present

## 2018-05-07 ENCOUNTER — Ambulatory Visit
Admission: RE | Admit: 2018-05-07 | Discharge: 2018-05-07 | Disposition: A | Discharge status: Home / Self Care | Payer: Medicare HMO | Source: Ambulatory Visit | Attending: Internal Medicine | Admitting: Internal Medicine

## 2018-05-07 DIAGNOSIS — R519 Headache, unspecified: Secondary | ICD-10-CM

## 2018-05-07 DIAGNOSIS — R51 Headache: Secondary | ICD-10-CM | POA: Insufficient documentation

## 2018-05-07 DIAGNOSIS — D329 Benign neoplasm of meninges, unspecified: Secondary | ICD-10-CM | POA: Diagnosis not present

## 2018-05-07 DIAGNOSIS — D320 Benign neoplasm of cerebral meninges: Secondary | ICD-10-CM | POA: Diagnosis not present

## 2018-05-07 MED ORDER — GADOBUTROL 1 MMOL/ML IV SOLN
6.0000 mL | Freq: Once | INTRAVENOUS | Status: AC | PRN
  Administered 2018-05-07: 6 mL via INTRAVENOUS

## 2018-05-10 DIAGNOSIS — H40003 Preglaucoma, unspecified, bilateral: Secondary | ICD-10-CM | POA: Diagnosis not present

## 2018-06-30 DIAGNOSIS — H40003 Preglaucoma, unspecified, bilateral: Secondary | ICD-10-CM | POA: Diagnosis not present

## 2018-07-06 DIAGNOSIS — Z872 Personal history of diseases of the skin and subcutaneous tissue: Secondary | ICD-10-CM | POA: Diagnosis not present

## 2018-07-06 DIAGNOSIS — L218 Other seborrheic dermatitis: Secondary | ICD-10-CM | POA: Diagnosis not present

## 2018-07-06 DIAGNOSIS — Z86018 Personal history of other benign neoplasm: Secondary | ICD-10-CM | POA: Diagnosis not present

## 2018-07-06 DIAGNOSIS — Z85828 Personal history of other malignant neoplasm of skin: Secondary | ICD-10-CM | POA: Diagnosis not present

## 2018-07-06 DIAGNOSIS — L578 Other skin changes due to chronic exposure to nonionizing radiation: Secondary | ICD-10-CM | POA: Diagnosis not present

## 2018-08-09 ENCOUNTER — Other Ambulatory Visit: Payer: Self-pay | Admitting: Internal Medicine

## 2018-08-09 DIAGNOSIS — Z1231 Encounter for screening mammogram for malignant neoplasm of breast: Secondary | ICD-10-CM

## 2018-08-10 DIAGNOSIS — D329 Benign neoplasm of meninges, unspecified: Secondary | ICD-10-CM | POA: Diagnosis not present

## 2018-08-10 DIAGNOSIS — I10 Essential (primary) hypertension: Secondary | ICD-10-CM | POA: Diagnosis not present

## 2018-08-10 DIAGNOSIS — E119 Type 2 diabetes mellitus without complications: Secondary | ICD-10-CM | POA: Diagnosis not present

## 2018-08-10 DIAGNOSIS — N649 Disorder of breast, unspecified: Secondary | ICD-10-CM | POA: Diagnosis not present

## 2018-08-10 DIAGNOSIS — F3342 Major depressive disorder, recurrent, in full remission: Secondary | ICD-10-CM | POA: Diagnosis not present

## 2018-08-11 ENCOUNTER — Other Ambulatory Visit: Payer: Self-pay | Admitting: Internal Medicine

## 2018-08-11 DIAGNOSIS — Z1231 Encounter for screening mammogram for malignant neoplasm of breast: Secondary | ICD-10-CM

## 2018-08-25 ENCOUNTER — Ambulatory Visit
Admission: RE | Admit: 2018-08-25 | Discharge: 2018-08-25 | Disposition: A | Discharge status: Home / Self Care | Payer: Medicare HMO | Source: Ambulatory Visit | Attending: Internal Medicine | Admitting: Internal Medicine

## 2018-08-25 DIAGNOSIS — Z1231 Encounter for screening mammogram for malignant neoplasm of breast: Secondary | ICD-10-CM | POA: Diagnosis not present

## 2018-10-11 DIAGNOSIS — E119 Type 2 diabetes mellitus without complications: Secondary | ICD-10-CM | POA: Diagnosis not present

## 2018-10-11 DIAGNOSIS — E034 Atrophy of thyroid (acquired): Secondary | ICD-10-CM | POA: Diagnosis not present

## 2018-10-11 DIAGNOSIS — D329 Benign neoplasm of meninges, unspecified: Secondary | ICD-10-CM | POA: Diagnosis not present

## 2018-10-11 DIAGNOSIS — E7849 Other hyperlipidemia: Secondary | ICD-10-CM | POA: Diagnosis not present

## 2018-10-11 DIAGNOSIS — M5416 Radiculopathy, lumbar region: Secondary | ICD-10-CM | POA: Diagnosis not present

## 2018-10-11 DIAGNOSIS — I10 Essential (primary) hypertension: Secondary | ICD-10-CM | POA: Diagnosis not present

## 2018-10-11 DIAGNOSIS — M48062 Spinal stenosis, lumbar region with neurogenic claudication: Secondary | ICD-10-CM | POA: Diagnosis not present

## 2018-10-18 DIAGNOSIS — I10 Essential (primary) hypertension: Secondary | ICD-10-CM | POA: Diagnosis not present

## 2018-10-18 DIAGNOSIS — Z0000 Encounter for general adult medical examination without abnormal findings: Secondary | ICD-10-CM | POA: Diagnosis not present

## 2018-12-30 DIAGNOSIS — H40002 Preglaucoma, unspecified, left eye: Secondary | ICD-10-CM | POA: Diagnosis not present

## 2019-01-03 DIAGNOSIS — H401421 Capsular glaucoma with pseudoexfoliation of lens, left eye, mild stage: Secondary | ICD-10-CM | POA: Diagnosis not present

## 2019-01-03 DIAGNOSIS — E1136 Type 2 diabetes mellitus with diabetic cataract: Secondary | ICD-10-CM | POA: Diagnosis not present

## 2019-03-01 DIAGNOSIS — M7541 Impingement syndrome of right shoulder: Secondary | ICD-10-CM | POA: Diagnosis not present

## 2019-03-01 DIAGNOSIS — G8929 Other chronic pain: Secondary | ICD-10-CM | POA: Diagnosis not present

## 2019-03-01 DIAGNOSIS — M7581 Other shoulder lesions, right shoulder: Secondary | ICD-10-CM | POA: Diagnosis not present

## 2019-03-01 DIAGNOSIS — M25511 Pain in right shoulder: Secondary | ICD-10-CM | POA: Diagnosis not present

## 2019-04-14 DIAGNOSIS — E119 Type 2 diabetes mellitus without complications: Secondary | ICD-10-CM | POA: Diagnosis not present

## 2019-04-14 DIAGNOSIS — M48062 Spinal stenosis, lumbar region with neurogenic claudication: Secondary | ICD-10-CM | POA: Diagnosis not present

## 2019-04-14 DIAGNOSIS — D329 Benign neoplasm of meninges, unspecified: Secondary | ICD-10-CM | POA: Diagnosis not present

## 2019-04-14 DIAGNOSIS — E034 Atrophy of thyroid (acquired): Secondary | ICD-10-CM | POA: Diagnosis not present

## 2019-04-14 DIAGNOSIS — M47816 Spondylosis without myelopathy or radiculopathy, lumbar region: Secondary | ICD-10-CM | POA: Diagnosis not present

## 2019-04-14 DIAGNOSIS — F3342 Major depressive disorder, recurrent, in full remission: Secondary | ICD-10-CM | POA: Diagnosis not present

## 2019-04-14 DIAGNOSIS — I10 Essential (primary) hypertension: Secondary | ICD-10-CM | POA: Diagnosis not present

## 2019-04-14 DIAGNOSIS — E7849 Other hyperlipidemia: Secondary | ICD-10-CM | POA: Diagnosis not present

## 2019-04-21 DIAGNOSIS — E785 Hyperlipidemia, unspecified: Secondary | ICD-10-CM | POA: Diagnosis not present

## 2019-04-21 DIAGNOSIS — M48062 Spinal stenosis, lumbar region with neurogenic claudication: Secondary | ICD-10-CM | POA: Diagnosis not present

## 2019-04-21 DIAGNOSIS — E034 Atrophy of thyroid (acquired): Secondary | ICD-10-CM | POA: Diagnosis not present

## 2019-04-21 DIAGNOSIS — Z87891 Personal history of nicotine dependence: Secondary | ICD-10-CM | POA: Diagnosis not present

## 2019-04-21 DIAGNOSIS — M5416 Radiculopathy, lumbar region: Secondary | ICD-10-CM | POA: Diagnosis not present

## 2019-04-21 DIAGNOSIS — I10 Essential (primary) hypertension: Secondary | ICD-10-CM | POA: Diagnosis not present

## 2019-04-21 DIAGNOSIS — E119 Type 2 diabetes mellitus without complications: Secondary | ICD-10-CM | POA: Diagnosis not present

## 2019-04-21 DIAGNOSIS — J309 Allergic rhinitis, unspecified: Secondary | ICD-10-CM | POA: Diagnosis not present

## 2019-04-21 DIAGNOSIS — D329 Benign neoplasm of meninges, unspecified: Secondary | ICD-10-CM | POA: Diagnosis not present

## 2019-05-31 DIAGNOSIS — M7541 Impingement syndrome of right shoulder: Secondary | ICD-10-CM | POA: Diagnosis not present

## 2019-05-31 DIAGNOSIS — G8929 Other chronic pain: Secondary | ICD-10-CM | POA: Diagnosis not present

## 2019-05-31 DIAGNOSIS — M25511 Pain in right shoulder: Secondary | ICD-10-CM | POA: Diagnosis not present

## 2019-05-31 DIAGNOSIS — M7551 Bursitis of right shoulder: Secondary | ICD-10-CM | POA: Diagnosis not present

## 2019-05-31 DIAGNOSIS — M778 Other enthesopathies, not elsewhere classified: Secondary | ICD-10-CM | POA: Diagnosis not present

## 2019-07-14 DIAGNOSIS — H401421 Capsular glaucoma with pseudoexfoliation of lens, left eye, mild stage: Secondary | ICD-10-CM | POA: Diagnosis not present

## 2019-07-17 DIAGNOSIS — E119 Type 2 diabetes mellitus without complications: Secondary | ICD-10-CM | POA: Diagnosis not present

## 2019-07-17 DIAGNOSIS — E7849 Other hyperlipidemia: Secondary | ICD-10-CM | POA: Diagnosis not present

## 2019-07-17 DIAGNOSIS — E034 Atrophy of thyroid (acquired): Secondary | ICD-10-CM | POA: Diagnosis not present

## 2019-07-24 DIAGNOSIS — E039 Hypothyroidism, unspecified: Secondary | ICD-10-CM | POA: Diagnosis not present

## 2019-07-24 DIAGNOSIS — M25511 Pain in right shoulder: Secondary | ICD-10-CM | POA: Diagnosis not present

## 2019-07-24 DIAGNOSIS — E119 Type 2 diabetes mellitus without complications: Secondary | ICD-10-CM | POA: Diagnosis not present

## 2019-07-24 DIAGNOSIS — E785 Hyperlipidemia, unspecified: Secondary | ICD-10-CM | POA: Diagnosis not present

## 2019-07-24 DIAGNOSIS — J309 Allergic rhinitis, unspecified: Secondary | ICD-10-CM | POA: Diagnosis not present

## 2019-07-24 DIAGNOSIS — I10 Essential (primary) hypertension: Secondary | ICD-10-CM | POA: Diagnosis not present

## 2019-07-24 DIAGNOSIS — D329 Benign neoplasm of meninges, unspecified: Secondary | ICD-10-CM | POA: Diagnosis not present

## 2019-07-24 DIAGNOSIS — M25551 Pain in right hip: Secondary | ICD-10-CM | POA: Diagnosis not present

## 2019-07-24 DIAGNOSIS — F17210 Nicotine dependence, cigarettes, uncomplicated: Secondary | ICD-10-CM | POA: Diagnosis not present

## 2019-07-31 ENCOUNTER — Other Ambulatory Visit: Payer: Self-pay | Admitting: Orthopedic Surgery

## 2019-07-31 DIAGNOSIS — M778 Other enthesopathies, not elsewhere classified: Secondary | ICD-10-CM | POA: Diagnosis not present

## 2019-07-31 DIAGNOSIS — M1711 Unilateral primary osteoarthritis, right knee: Secondary | ICD-10-CM | POA: Diagnosis not present

## 2019-07-31 DIAGNOSIS — M1611 Unilateral primary osteoarthritis, right hip: Secondary | ICD-10-CM | POA: Diagnosis not present

## 2019-08-09 ENCOUNTER — Other Ambulatory Visit: Payer: Self-pay

## 2019-08-09 ENCOUNTER — Ambulatory Visit
Admission: RE | Admit: 2019-08-09 | Discharge: 2019-08-09 | Disposition: A | Discharge status: Home / Self Care | Payer: Medicare HMO | Source: Ambulatory Visit | Attending: Orthopedic Surgery | Admitting: Orthopedic Surgery

## 2019-08-09 DIAGNOSIS — M778 Other enthesopathies, not elsewhere classified: Secondary | ICD-10-CM | POA: Insufficient documentation

## 2019-08-09 DIAGNOSIS — M25511 Pain in right shoulder: Secondary | ICD-10-CM | POA: Diagnosis not present

## 2019-08-14 DIAGNOSIS — M7581 Other shoulder lesions, right shoulder: Secondary | ICD-10-CM | POA: Diagnosis not present

## 2019-08-14 DIAGNOSIS — M75121 Complete rotator cuff tear or rupture of right shoulder, not specified as traumatic: Secondary | ICD-10-CM | POA: Diagnosis not present

## 2019-08-14 DIAGNOSIS — M7521 Bicipital tendinitis, right shoulder: Secondary | ICD-10-CM | POA: Diagnosis not present

## 2019-09-12 ENCOUNTER — Other Ambulatory Visit: Payer: Self-pay | Admitting: Surgery

## 2019-09-27 ENCOUNTER — Other Ambulatory Visit: Payer: Medicare HMO

## 2019-10-03 ENCOUNTER — Other Ambulatory Visit: Payer: Medicare HMO

## 2019-10-05 ENCOUNTER — Ambulatory Visit: Admit: 2019-10-05 | Payer: Medicare HMO | Admitting: Surgery

## 2019-10-05 SURGERY — SHOULDER ARTHROSCOPY WITH SUBACROMIAL DECOMPRESSION AND OPEN ROTATOR CUFF REPAIR, OPEN BICEPS TENDON REPAIR
Anesthesia: Choice | Site: Shoulder | Laterality: Right

## 2019-11-15 DIAGNOSIS — E7849 Other hyperlipidemia: Secondary | ICD-10-CM | POA: Diagnosis not present

## 2019-11-15 DIAGNOSIS — K219 Gastro-esophageal reflux disease without esophagitis: Secondary | ICD-10-CM | POA: Diagnosis not present

## 2019-11-15 DIAGNOSIS — E119 Type 2 diabetes mellitus without complications: Secondary | ICD-10-CM | POA: Diagnosis not present

## 2019-11-15 DIAGNOSIS — M5416 Radiculopathy, lumbar region: Secondary | ICD-10-CM | POA: Diagnosis not present

## 2019-11-24 DIAGNOSIS — D329 Benign neoplasm of meninges, unspecified: Secondary | ICD-10-CM | POA: Diagnosis not present

## 2019-11-24 DIAGNOSIS — E119 Type 2 diabetes mellitus without complications: Secondary | ICD-10-CM | POA: Diagnosis not present

## 2019-11-24 DIAGNOSIS — F3342 Major depressive disorder, recurrent, in full remission: Secondary | ICD-10-CM | POA: Diagnosis not present

## 2019-11-24 DIAGNOSIS — M48062 Spinal stenosis, lumbar region with neurogenic claudication: Secondary | ICD-10-CM | POA: Diagnosis not present

## 2019-11-24 DIAGNOSIS — I10 Essential (primary) hypertension: Secondary | ICD-10-CM | POA: Diagnosis not present

## 2019-11-24 DIAGNOSIS — M5416 Radiculopathy, lumbar region: Secondary | ICD-10-CM | POA: Diagnosis not present

## 2019-11-24 DIAGNOSIS — E034 Atrophy of thyroid (acquired): Secondary | ICD-10-CM | POA: Diagnosis not present

## 2019-11-24 DIAGNOSIS — E7849 Other hyperlipidemia: Secondary | ICD-10-CM | POA: Diagnosis not present

## 2019-11-24 DIAGNOSIS — K219 Gastro-esophageal reflux disease without esophagitis: Secondary | ICD-10-CM | POA: Diagnosis not present

## 2020-01-10 DIAGNOSIS — H40002 Preglaucoma, unspecified, left eye: Secondary | ICD-10-CM | POA: Diagnosis not present

## 2020-01-19 DIAGNOSIS — H401422 Capsular glaucoma with pseudoexfoliation of lens, left eye, moderate stage: Secondary | ICD-10-CM | POA: Diagnosis not present

## 2020-02-27 DIAGNOSIS — H401422 Capsular glaucoma with pseudoexfoliation of lens, left eye, moderate stage: Secondary | ICD-10-CM | POA: Diagnosis not present

## 2020-03-14 DIAGNOSIS — E7849 Other hyperlipidemia: Secondary | ICD-10-CM | POA: Diagnosis not present

## 2020-03-14 DIAGNOSIS — K219 Gastro-esophageal reflux disease without esophagitis: Secondary | ICD-10-CM | POA: Diagnosis not present

## 2020-03-14 DIAGNOSIS — E119 Type 2 diabetes mellitus without complications: Secondary | ICD-10-CM | POA: Diagnosis not present

## 2020-03-22 DIAGNOSIS — E119 Type 2 diabetes mellitus without complications: Secondary | ICD-10-CM | POA: Diagnosis not present

## 2020-03-22 DIAGNOSIS — I10 Essential (primary) hypertension: Secondary | ICD-10-CM | POA: Diagnosis not present

## 2020-03-22 DIAGNOSIS — M48062 Spinal stenosis, lumbar region with neurogenic claudication: Secondary | ICD-10-CM | POA: Diagnosis not present

## 2020-03-22 DIAGNOSIS — E034 Atrophy of thyroid (acquired): Secondary | ICD-10-CM | POA: Diagnosis not present

## 2020-03-22 DIAGNOSIS — Z86011 Personal history of benign neoplasm of the brain: Secondary | ICD-10-CM | POA: Diagnosis not present

## 2020-03-22 DIAGNOSIS — Z0000 Encounter for general adult medical examination without abnormal findings: Secondary | ICD-10-CM | POA: Diagnosis not present

## 2020-03-22 DIAGNOSIS — E7849 Other hyperlipidemia: Secondary | ICD-10-CM | POA: Diagnosis not present

## 2020-04-02 DIAGNOSIS — M75121 Complete rotator cuff tear or rupture of right shoulder, not specified as traumatic: Secondary | ICD-10-CM | POA: Diagnosis not present

## 2020-04-15 DIAGNOSIS — E1136 Type 2 diabetes mellitus with diabetic cataract: Secondary | ICD-10-CM | POA: Diagnosis not present

## 2020-04-15 DIAGNOSIS — H401422 Capsular glaucoma with pseudoexfoliation of lens, left eye, moderate stage: Secondary | ICD-10-CM | POA: Diagnosis not present

## 2020-09-13 DIAGNOSIS — M5416 Radiculopathy, lumbar region: Secondary | ICD-10-CM | POA: Diagnosis not present

## 2020-09-13 DIAGNOSIS — H401422 Capsular glaucoma with pseudoexfoliation of lens, left eye, moderate stage: Secondary | ICD-10-CM | POA: Diagnosis not present

## 2020-09-13 DIAGNOSIS — E1136 Type 2 diabetes mellitus with diabetic cataract: Secondary | ICD-10-CM | POA: Diagnosis not present

## 2020-09-13 DIAGNOSIS — E119 Type 2 diabetes mellitus without complications: Secondary | ICD-10-CM | POA: Diagnosis not present

## 2020-09-13 DIAGNOSIS — E7849 Other hyperlipidemia: Secondary | ICD-10-CM | POA: Diagnosis not present

## 2020-09-13 DIAGNOSIS — K219 Gastro-esophageal reflux disease without esophagitis: Secondary | ICD-10-CM | POA: Diagnosis not present

## 2020-09-20 DIAGNOSIS — E034 Atrophy of thyroid (acquired): Secondary | ICD-10-CM | POA: Diagnosis not present

## 2020-09-20 DIAGNOSIS — M47816 Spondylosis without myelopathy or radiculopathy, lumbar region: Secondary | ICD-10-CM | POA: Diagnosis not present

## 2020-09-20 DIAGNOSIS — F3342 Major depressive disorder, recurrent, in full remission: Secondary | ICD-10-CM | POA: Diagnosis not present

## 2020-09-20 DIAGNOSIS — K219 Gastro-esophageal reflux disease without esophagitis: Secondary | ICD-10-CM | POA: Diagnosis not present

## 2020-09-20 DIAGNOSIS — I10 Essential (primary) hypertension: Secondary | ICD-10-CM | POA: Diagnosis not present

## 2020-09-20 DIAGNOSIS — D329 Benign neoplasm of meninges, unspecified: Secondary | ICD-10-CM | POA: Diagnosis not present

## 2020-09-20 DIAGNOSIS — E038 Other specified hypothyroidism: Secondary | ICD-10-CM | POA: Diagnosis not present

## 2020-09-20 DIAGNOSIS — E7849 Other hyperlipidemia: Secondary | ICD-10-CM | POA: Diagnosis not present

## 2020-09-20 DIAGNOSIS — M5416 Radiculopathy, lumbar region: Secondary | ICD-10-CM | POA: Diagnosis not present

## 2020-09-20 DIAGNOSIS — E119 Type 2 diabetes mellitus without complications: Secondary | ICD-10-CM | POA: Diagnosis not present

## 2021-01-20 DIAGNOSIS — H401422 Capsular glaucoma with pseudoexfoliation of lens, left eye, moderate stage: Secondary | ICD-10-CM | POA: Diagnosis not present

## 2021-02-03 DIAGNOSIS — H40001 Preglaucoma, unspecified, right eye: Secondary | ICD-10-CM | POA: Diagnosis not present

## 2021-03-21 DIAGNOSIS — K219 Gastro-esophageal reflux disease without esophagitis: Secondary | ICD-10-CM | POA: Diagnosis not present

## 2021-03-21 DIAGNOSIS — M5416 Radiculopathy, lumbar region: Secondary | ICD-10-CM | POA: Diagnosis not present

## 2021-03-21 DIAGNOSIS — E7849 Other hyperlipidemia: Secondary | ICD-10-CM | POA: Diagnosis not present

## 2021-03-21 DIAGNOSIS — E034 Atrophy of thyroid (acquired): Secondary | ICD-10-CM | POA: Diagnosis not present

## 2021-03-21 DIAGNOSIS — E119 Type 2 diabetes mellitus without complications: Secondary | ICD-10-CM | POA: Diagnosis not present

## 2021-03-28 DIAGNOSIS — E119 Type 2 diabetes mellitus without complications: Secondary | ICD-10-CM | POA: Diagnosis not present

## 2021-03-28 DIAGNOSIS — E782 Mixed hyperlipidemia: Secondary | ICD-10-CM | POA: Diagnosis not present

## 2021-03-28 DIAGNOSIS — I10 Essential (primary) hypertension: Secondary | ICD-10-CM | POA: Diagnosis not present

## 2021-03-28 DIAGNOSIS — Z1389 Encounter for screening for other disorder: Secondary | ICD-10-CM | POA: Diagnosis not present

## 2021-03-28 DIAGNOSIS — E034 Atrophy of thyroid (acquired): Secondary | ICD-10-CM | POA: Diagnosis not present

## 2021-03-28 DIAGNOSIS — D329 Benign neoplasm of meninges, unspecified: Secondary | ICD-10-CM | POA: Diagnosis not present

## 2021-03-28 DIAGNOSIS — M48062 Spinal stenosis, lumbar region with neurogenic claudication: Secondary | ICD-10-CM | POA: Diagnosis not present

## 2021-03-28 DIAGNOSIS — Z780 Asymptomatic menopausal state: Secondary | ICD-10-CM | POA: Diagnosis not present

## 2021-03-28 DIAGNOSIS — Z0000 Encounter for general adult medical examination without abnormal findings: Secondary | ICD-10-CM | POA: Diagnosis not present

## 2021-06-10 DIAGNOSIS — M1711 Unilateral primary osteoarthritis, right knee: Secondary | ICD-10-CM | POA: Diagnosis not present

## 2021-06-10 DIAGNOSIS — E119 Type 2 diabetes mellitus without complications: Secondary | ICD-10-CM | POA: Diagnosis not present

## 2021-09-23 DIAGNOSIS — K219 Gastro-esophageal reflux disease without esophagitis: Secondary | ICD-10-CM | POA: Diagnosis not present

## 2021-09-23 DIAGNOSIS — E034 Atrophy of thyroid (acquired): Secondary | ICD-10-CM | POA: Diagnosis not present

## 2021-09-23 DIAGNOSIS — E119 Type 2 diabetes mellitus without complications: Secondary | ICD-10-CM | POA: Diagnosis not present

## 2021-09-23 DIAGNOSIS — M48062 Spinal stenosis, lumbar region with neurogenic claudication: Secondary | ICD-10-CM | POA: Diagnosis not present

## 2021-09-23 DIAGNOSIS — E7849 Other hyperlipidemia: Secondary | ICD-10-CM | POA: Diagnosis not present

## 2021-09-30 DIAGNOSIS — D329 Benign neoplasm of meninges, unspecified: Secondary | ICD-10-CM | POA: Diagnosis not present

## 2021-09-30 DIAGNOSIS — I10 Essential (primary) hypertension: Secondary | ICD-10-CM | POA: Diagnosis not present

## 2021-09-30 DIAGNOSIS — E119 Type 2 diabetes mellitus without complications: Secondary | ICD-10-CM | POA: Diagnosis not present

## 2021-09-30 DIAGNOSIS — E034 Atrophy of thyroid (acquired): Secondary | ICD-10-CM | POA: Diagnosis not present

## 2021-09-30 DIAGNOSIS — M48062 Spinal stenosis, lumbar region with neurogenic claudication: Secondary | ICD-10-CM | POA: Diagnosis not present

## 2021-09-30 DIAGNOSIS — F3342 Major depressive disorder, recurrent, in full remission: Secondary | ICD-10-CM | POA: Diagnosis not present

## 2021-09-30 DIAGNOSIS — E785 Hyperlipidemia, unspecified: Secondary | ICD-10-CM | POA: Diagnosis not present

## 2021-11-04 DIAGNOSIS — E119 Type 2 diabetes mellitus without complications: Secondary | ICD-10-CM | POA: Diagnosis not present

## 2021-11-04 DIAGNOSIS — Z0100 Encounter for examination of eyes and vision without abnormal findings: Secondary | ICD-10-CM | POA: Diagnosis not present

## 2021-11-04 DIAGNOSIS — H2513 Age-related nuclear cataract, bilateral: Secondary | ICD-10-CM | POA: Diagnosis not present

## 2021-11-04 DIAGNOSIS — H401422 Capsular glaucoma with pseudoexfoliation of lens, left eye, moderate stage: Secondary | ICD-10-CM | POA: Diagnosis not present

## 2021-12-30 DIAGNOSIS — H2512 Age-related nuclear cataract, left eye: Secondary | ICD-10-CM | POA: Diagnosis not present

## 2022-01-09 ENCOUNTER — Encounter: Payer: Self-pay | Admitting: Emergency Medicine

## 2022-01-09 ENCOUNTER — Emergency Department
Admission: EM | Admit: 2022-01-09 | Discharge: 2022-01-09 | Disposition: A | Discharge status: Home / Self Care | Payer: Medicare HMO | Attending: Emergency Medicine | Admitting: Emergency Medicine

## 2022-01-09 ENCOUNTER — Emergency Department: Payer: Medicare HMO

## 2022-01-09 ENCOUNTER — Other Ambulatory Visit: Payer: Self-pay

## 2022-01-09 DIAGNOSIS — R079 Chest pain, unspecified: Secondary | ICD-10-CM

## 2022-01-09 DIAGNOSIS — E039 Hypothyroidism, unspecified: Secondary | ICD-10-CM | POA: Insufficient documentation

## 2022-01-09 DIAGNOSIS — Z85828 Personal history of other malignant neoplasm of skin: Secondary | ICD-10-CM | POA: Insufficient documentation

## 2022-01-09 DIAGNOSIS — R0789 Other chest pain: Secondary | ICD-10-CM | POA: Diagnosis not present

## 2022-01-09 DIAGNOSIS — I10 Essential (primary) hypertension: Secondary | ICD-10-CM | POA: Diagnosis not present

## 2022-01-09 DIAGNOSIS — E119 Type 2 diabetes mellitus without complications: Secondary | ICD-10-CM | POA: Diagnosis not present

## 2022-01-09 LAB — CBC
HCT: 42.8 % (ref 36.0–46.0)
Hemoglobin: 14.1 g/dL (ref 12.0–15.0)
MCH: 31.3 pg (ref 26.0–34.0)
MCHC: 32.9 g/dL (ref 30.0–36.0)
MCV: 94.9 fL (ref 80.0–100.0)
Platelets: 322 10*3/uL (ref 150–400)
RBC: 4.51 MIL/uL (ref 3.87–5.11)
RDW: 12.1 % (ref 11.5–15.5)
WBC: 7.6 10*3/uL (ref 4.0–10.5)
nRBC: 0 % (ref 0.0–0.2)

## 2022-01-09 LAB — BASIC METABOLIC PANEL
Anion gap: 10 (ref 5–15)
BUN: 16 mg/dL (ref 8–23)
CO2: 27 mmol/L (ref 22–32)
Calcium: 9.8 mg/dL (ref 8.9–10.3)
Chloride: 97 mmol/L — ABNORMAL LOW (ref 98–111)
Creatinine, Ser: 0.69 mg/dL (ref 0.44–1.00)
GFR, Estimated: >60 mL/min (ref >60)
Glucose, Bld: 107 mg/dL — ABNORMAL HIGH (ref 70–99)
Potassium: 3.8 mmol/L (ref 3.5–5.1)
Sodium: 134 mmol/L — ABNORMAL LOW (ref 135–145)

## 2022-01-09 LAB — TROPONIN I (HIGH SENSITIVITY): Troponin I (High Sensitivity): <2 ng/L (ref <18)

## 2022-01-09 NOTE — ED Triage Notes (Signed)
Pt here with CP that occurred on yesterday. Pt states the pain went across her chest and she felt like it went to her ribs. Pt took some Tums and it helped. Pt states the pain was constant.

## 2022-01-09 NOTE — ED Provider Notes (Signed)
[facility] Provider Note    Event Date/Time   First MD Initiated Contact with Patient 01/09/22 1257     (approximate)   History   Chest Pain   HPI  Julia Wilson is a 81 y.o. 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  She states she took some Tums and a liquid over-the-counter antacid and then following that had a meal later in the day and felt drastically better.  She states she has continued to feel better today with only very slight residual discomfort but this has been steadily improving throughout the day.  She denies any back pain, fevers, shortness of breath, abdominal pain, nausea, vomiting, diarrhea or difficulty eating drinking chewing or swallowing today.  She states she thinks it was possibly a food bolus stuck in her esophagus that was pushed farther down after she ate a subsequent meal and/or had some reflux.  No recent tobacco abuse or EtOH use.  She denies any other acute concerns at this time.  He does part chronic intermittent dry cough related to seasonal allergies is not any different today than usual.   Past Medical History:  Diagnosis Date   Cancer [facility])    skin     Physical Exam  Triage Vital Signs: ED Triage Vitals  Enc Vitals Group     BP 01/09/22 1145 (!) 163/84     Pulse Rate 01/09/22 1145 73     Resp 01/09/22 1145 18     Temp 01/09/22 1145 98.1 F (36.7 C)     Temp Source 01/09/22 1145 Oral     SpO2 01/09/22 1145 97 %     Weight 01/09/22 1146 156 lb (70.8 kg)     Height 01/09/22 1146 '5\' 4"'$  (1.626 m)     Head Circumference --      Peak Flow --      Pain Score 01/09/22 1200 5     Pain Loc --      Pain Edu? --       Excl. in [place]? --     Most recent vital signs: Vitals:   01/09/22 1145  BP: (!) 163/84  Pulse: 73  Resp: 18  Temp: 98.1 F (36.7 C)  SpO2: 97%    General: Awake, no distress.  CV:  Good peripheral perfusion.  2+ radial pulse.  No significant murmur. Resp:  Normal effort.  Abd:  No distention.  Soft throughout. Other:     ED Results / Procedures / Treatments  Labs (all labs ordered are listed, but only abnormal results are displayed) Labs Reviewed  BASIC METABOLIC PANEL - Abnormal; Notable for the following components:      Result Value   Sodium 134 (*)    Chloride 97 (*)    Glucose, Bld 107 (*)    All other components within normal limits  CBC  TROPONIN I (HIGH SENSITIVITY)     EKG  EKG is remarkable for sinus rhythm with a ventricular rate of 74, normal axis, unremarkable intervals with some artifact in leads I and lead II and lead III without clear evidence of acute ischemia or significant arrhythmia.   RADIOLOGY Chest reviewed by myself shows no focal consoidation, effusion, edema, pneumothorax or other clear  acute thoracic process. I also reviewed radiology interpretation and agree with findings described.    PROCEDURES:  Critical Care performed: No  Procedures    MEDICATIONS ORDERED IN ED: Medications - No data to display   IMPRESSION / MDM / [place] / ED COURSE  I reviewed the triage vital signs and the nursing notes. Patient's presentation is most consistent with acute presentation with potential threat to life or bodily function.                               Differential diagnosis includes, but is not limited to, possible food bolus, esophagitis reflux or peptic ulcer disease, ACS, pneumonia, pneumothorax with overall much lower suspicion based on her history and exam and vital signs for dissection or PE.  EKG is remarkable for sinus rhythm with a ventricular rate of 74, normal axis, unremarkable intervals with some artifact in  leads I and lead II and lead III without clear evidence of acute ischemia or significant arrhythmia.  Nonelevated troponin obtained greater than 3 hours after symptom onset is not suggestive of ACS or myocarditis.  Chest reviewed by myself shows no focal consoidation, effusion, edema, pneumothorax or other clear acute thoracic process. I also reviewed radiology interpretation and agree with findings described.  CBC without cytosis or acute anemia.  BMP without any significant lecture light or metabolic derangements.  Given patient has had steady improvement in her discomfort since yesterday with otherwise low suspicion for other immediate life-threatening process think she is appropriate for outpatient further evaluation.  Low suspicion for immediate life-threatening process.  She was offered antacid emergency room but is declining this.  Discharged in stable condition.  Strict and precautions advised and discussed.  Advised to have blood pressure rechecked at follow-up visit.      FINAL CLINICAL IMPRESSION(S) / ED DIAGNOSES   Final diagnoses:  Chest pain, unspecified type  Hypertension, unspecified type     Rx / DC Orders   ED Discharge Orders     None        Note:  This document was prepared using Dragon voice recognition software and may include unintentional dictation errors.   Lucrezia Starch, MD 01/09/22 [phone]

## 2022-01-13 ENCOUNTER — Encounter: Payer: Self-pay | Admitting: Ophthalmology

## 2022-01-19 NOTE — Discharge Instructions (Signed)

## 2022-01-21 ENCOUNTER — Other Ambulatory Visit: Payer: Self-pay

## 2022-01-21 ENCOUNTER — Ambulatory Visit: Payer: Medicare HMO | Admitting: Anesthesiology

## 2022-01-21 ENCOUNTER — Ambulatory Visit
Admission: RE | Admit: 2022-01-21 | Discharge: 2022-01-21 | Disposition: A | Discharge status: Home / Self Care | Payer: Medicare HMO | Attending: Ophthalmology | Admitting: Ophthalmology

## 2022-01-21 ENCOUNTER — Encounter
Admission: RE | Disposition: A | Discharge status: Home / Self Care | Payer: Self-pay | Source: Home / Self Care | Attending: Ophthalmology

## 2022-01-21 ENCOUNTER — Ambulatory Visit (AMBULATORY_SURGERY_CENTER): Payer: Medicare HMO | Admitting: Anesthesiology

## 2022-01-21 ENCOUNTER — Encounter: Payer: Self-pay | Admitting: Ophthalmology

## 2022-01-21 DIAGNOSIS — E1136 Type 2 diabetes mellitus with diabetic cataract: Secondary | ICD-10-CM | POA: Diagnosis not present

## 2022-01-21 DIAGNOSIS — Z79899 Other long term (current) drug therapy: Secondary | ICD-10-CM | POA: Insufficient documentation

## 2022-01-21 DIAGNOSIS — H2512 Age-related nuclear cataract, left eye: Secondary | ICD-10-CM | POA: Insufficient documentation

## 2022-01-21 DIAGNOSIS — Z87891 Personal history of nicotine dependence: Secondary | ICD-10-CM

## 2022-01-21 DIAGNOSIS — K219 Gastro-esophageal reflux disease without esophagitis: Secondary | ICD-10-CM | POA: Diagnosis not present

## 2022-01-21 DIAGNOSIS — E039 Hypothyroidism, unspecified: Secondary | ICD-10-CM | POA: Insufficient documentation

## 2022-01-21 DIAGNOSIS — H4010X0 Unspecified open-angle glaucoma, stage unspecified: Secondary | ICD-10-CM | POA: Diagnosis not present

## 2022-01-21 DIAGNOSIS — Z7984 Long term (current) use of oral hypoglycemic drugs: Secondary | ICD-10-CM | POA: Diagnosis not present

## 2022-01-21 DIAGNOSIS — I10 Essential (primary) hypertension: Secondary | ICD-10-CM | POA: Diagnosis not present

## 2022-01-21 DIAGNOSIS — H401422 Capsular glaucoma with pseudoexfoliation of lens, left eye, moderate stage: Secondary | ICD-10-CM | POA: Diagnosis not present

## 2022-01-21 DIAGNOSIS — E119 Type 2 diabetes mellitus without complications: Secondary | ICD-10-CM

## 2022-01-21 HISTORY — DX: Hypothyroidism, unspecified: E03.9

## 2022-01-21 HISTORY — DX: Essential (primary) hypertension: I10

## 2022-01-21 HISTORY — DX: Gastro-esophageal reflux disease without esophagitis: K21.9

## 2022-01-21 HISTORY — PX: CATARACT EXTRACTION W/PHACO: SHX586

## 2022-01-21 HISTORY — DX: Type 2 diabetes mellitus without complications: E11.9

## 2022-01-21 HISTORY — DX: Dizziness and giddiness: R42

## 2022-01-21 LAB — GLUCOSE, CAPILLARY
Glucose-Capillary: 115 mg/dL — ABNORMAL HIGH (ref 70–99)
Glucose-Capillary: 111 mg/dL — ABNORMAL HIGH (ref 70–99)

## 2022-01-21 SURGERY — PHACOEMULSIFICATION, CATARACT, WITH IOL INSERTION
Anesthesia: Monitor Anesthesia Care | Site: Eye | Laterality: Left

## 2022-01-21 MED ORDER — NA CHONDROIT SULF-NA HYALURON 40-30 MG/ML IO SOSY
INTRAOCULAR | Status: DC | PRN
  Administered 2022-01-21: 0.5 mL via INTRAOCULAR

## 2022-01-21 MED ORDER — MIDAZOLAM HCL 2 MG/2ML IJ SOLN
INTRAMUSCULAR | Status: DC | PRN
  Administered 2022-01-21: 1 mg via INTRAVENOUS

## 2022-01-21 MED ORDER — FENTANYL CITRATE PF 50 MCG/ML IJ SOSY: 25.0000 ug | PREFILLED_SYRINGE | INTRAMUSCULAR | Status: DC | PRN

## 2022-01-21 MED ORDER — ARMC OPHTHALMIC DILATING DROPS
1.0000 | OPHTHALMIC | Status: DC | PRN
  Administered 2022-01-21 (×3): 1 via OPHTHALMIC

## 2022-01-21 MED ORDER — ONDANSETRON HCL 4 MG/2ML IJ SOLN: 4.0000 mg | Freq: Once | INTRAMUSCULAR | Status: DC | PRN

## 2022-01-21 MED ORDER — SIGHTPATH DOSE#1 BSS IO SOLN
INTRAOCULAR | Status: DC | PRN
  Administered 2022-01-21: 1 mL via INTRAMUSCULAR

## 2022-01-21 MED ORDER — TETRACAINE HCL 0.5 % OP SOLN
1.0000 [drp] | OPHTHALMIC | Status: DC | PRN
  Administered 2022-01-21 (×3): 1 [drp] via OPHTHALMIC

## 2022-01-21 MED ORDER — FENTANYL CITRATE (PF) 100 MCG/2ML IJ SOLN
INTRAMUSCULAR | Status: DC | PRN
Start: 2022-01-21 — End: 2022-01-21
  Administered 2022-01-21: 50 ug via INTRAVENOUS

## 2022-01-21 MED ORDER — SIGHTPATH DOSE#1 BSS IO SOLN
INTRAOCULAR | Status: DC | PRN
  Administered 2022-01-21: 15 mL

## 2022-01-21 MED ORDER — MOXIFLOXACIN HCL 0.5 % OP SOLN
OPHTHALMIC | Status: DC | PRN
  Administered 2022-01-21: 0.2 mL via OPHTHALMIC

## 2022-01-21 MED ORDER — SIGHTPATH DOSE#1 NA HYALUR & NA CHOND-NA HYALUR IO KIT
PACK | INTRAOCULAR | Status: DC | PRN
  Administered 2022-01-21: 1 via OPHTHALMIC

## 2022-01-21 MED ORDER — SIGHTPATH DOSE#1 BSS IO SOLN
INTRAOCULAR | Status: DC | PRN
  Administered 2022-01-21: 71 mL via OPHTHALMIC

## 2022-01-21 SURGICAL SUPPLY — 14 items
BLADE DUAL KAHOOK SINGLE USE (BLADE) ×1 IMPLANT
CATARACT SUITE SIGHTPATH (MISCELLANEOUS) ×2 IMPLANT
FEE CATARACT SUITE SIGHTPATH (MISCELLANEOUS) ×1 IMPLANT
GLOVE SRG 8 PF TXTR STRL LF DI (GLOVE) ×1 IMPLANT
GLOVE SURG ENC TEXT LTX SZ7.5 (GLOVE) ×2 IMPLANT
GLOVE SURG UNDER POLY LF SZ8 (GLOVE) ×2
ICLIP (OPHTHALMIC RELATED) ×1 IMPLANT
LENS IOL TECNIS EYHANCE 24.0 (Intraocular Lens) ×1 IMPLANT
NDL FILTER BLUNT 18X1 1/2 (NEEDLE) ×1 IMPLANT
NEEDLE FILTER BLUNT 18X 1/2SAF (NEEDLE) ×1
NEEDLE FILTER BLUNT 18X1 1/2 (NEEDLE) ×1 IMPLANT
RING MALYGIN 7.0 (MISCELLANEOUS) ×1 IMPLANT
SYR 3ML LL SCALE MARK (SYRINGE) ×2 IMPLANT
WATER STERILE IRR 250ML POUR (IV SOLUTION) ×2 IMPLANT

## 2022-01-21 NOTE — Op Note (Signed)
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 Item Serial No. Manufacturer Lot No. LRB No. Used Action  LENS IOL TECNIS EYHANCE 24.0 - [id number] Intraocular Lens LENS IOL TECNIS EYHANCE 24.0 [id number] [employer]  Left 1 Implanted    SURGEON:  Wyonia Hough, MD   ANESTHESIA:  Topical with tetracaine drops augmented with 1% preservative-free intracameral lidocaine.    COMPLICATIONS:  None.   DESCRIPTION OF PROCEDURE:  The patient was identified in the holding room and transported to the operating room and placed in the supine position under the operating microscope.  The left eye was identified as the operative eye and it was prepped and draped in the usual sterile ophthalmic fashion.   A 1 millimeter clear-corneal paracentesis was made at the 5:30 position.  0.5 ml of preservative-free 1% lidocaine was injected into the anterior chamber.  The anterior chamber was filled with Viscoat viscoelastic.  A 2.4 millimeter keratome was used to make a near-clear corneal incision at the 2:30 position. The microscope was adjusted and a gonioprism was used to visulaize the trabecular meshwork.  The [facility] Dual Blade was advanced across the anterior chamber under viscoelastic.  The blade was used to mark the trabecular meshwork at the 7:30 position.  The blade  was placed two clock hours clockwise into the meshwork.  Proper postioning was confirmed.  The blade ws passed counterclockwise through the meshwork to excise approximately two to three clock-hours of trabecular meshwork. A malyugin ring was placed to expand the pupil to 66m.   A curvilinear capsulorrhexis was made with a cystotome and capsulorrhexis forceps.  Balanced salt solution was used to hydrodissect and hydrodelineate the nucleus.   Phacoemulsification was then used in stop and chop fashion to remove the lens nucleus and epinucleus.  The remaining cortex was then removed using the irrigation and aspiration handpiece. Provisc was then placed into the capsular bag to distend it for lens placement.  A lens was then injected into the capsular bag.  The Malyugin ring was removed. The remaining viscoelastic was aspirated.   Wounds were hydrated with balanced salt solution.  The anterior chamber was inflated to a physiologic pressure with balanced salt solution.  No wound leaks were noted. Vigamox 0.2 ml of a '1mg'$  per ml solution was injected into the anterior chamber for a dose of 0.2 mg of intracameral antibiotic at the completion of the case.  The patient was taken to the recovery room in stable condition without complications of anesthesia or surgery.

## 2022-01-21 NOTE — Transfer of Care (Signed)
Immediate Anesthesia Transfer of Care Note  Patient: Julia Wilson  Procedure(s) Performed: CATARACT EXTRACTION PHACO AND INTRAOCULAR LENS PLACEMENT (IOC) COMPLICATED LEFT 17.49 44:96.7 (Left: Eye) Goniotomy (Left: Eye)  Patient Location: PACU  Anesthesia Type:MAC  Level of Consciousness: awake, alert  and oriented  Airway & Oxygen Therapy: Patient Spontanous Breathing  Post-op Assessment: Report given to RN and Post -op Vital signs reviewed and stable  Post vital signs: Reviewed  Last Vitals:  Vitals Value Taken Time  BP    Temp    Pulse 73 01/21/22 0807  Resp 15 01/21/22 0807  SpO2 98 % 01/21/22 0807  Vitals shown include unvalidated device data.  Last Pain:  Vitals:   01/21/22 0637  TempSrc: Temporal  PainSc: 0-No pain         Complications: No notable events documented.

## 2022-01-21 NOTE — H&P (Signed)
[facility]   Primary Care Physician:  Adin Hector, MD Ophthalmologist: Dr. Leandrew Koyanagi  Pre-Procedure History & Physical: HPI:  Julia Wilson is a 81 y.o. female here for ophthalmic surgery.   Past Medical History:  Diagnosis Date   Cancer ([place])    skin   GERD (gastroesophageal reflux disease)    Hypertension    Hypothyroidism    Type 2 diabetes mellitus ([place])    Vertigo    1 episode, approx 2018    Past Surgical History:  Procedure Laterality Date   REDUCTION MAMMAPLASTY  1983    Prior to Admission medications   Medication Sig Start Date End Date Taking? Authorizing Provider  cholecalciferol (VITAMIN D3) 25 MCG (1000 UNIT) tablet Take 1,000 Units by mouth daily.   Yes [provider]  dorzolamide (TRUSOPT) 2 % ophthalmic solution 1 drop daily.   Yes [provider]  FLUoxetine (PROZAC) 20 MG tablet Take 20 mg by mouth daily.   Yes [provider]  folic acid (FOLVITE) 831 MCG tablet Take 400 mcg by mouth daily.   Yes [provider]  latanoprost (XALATAN) 0.005 % ophthalmic solution Place 1 drop into both eyes at bedtime.   Yes [provider]  levothyroxine (SYNTHROID) 50 MCG tablet Take 50 mcg by mouth daily before breakfast.   Yes [provider]  losartan-hydrochlorothiazide (HYZAAR) 50-12.5 MG tablet Take 1 tablet by mouth daily.   Yes [provider]  lovastatin (MEVACOR) 40 MG tablet Take 40 mg by mouth at bedtime.   Yes [provider]  metFORMIN (GLUCOPHAGE) 500 MG tablet Take by mouth 2 (two) times daily with a meal.   Yes [provider]  omeprazole (PRILOSEC) 20 MG capsule Take 20 mg by mouth daily.   Yes [provider]  gabapentin (NEURONTIN) 300 MG capsule Take 300 mg by mouth as needed. Patient not taking: Reported on 01/13/2022    [provider]    Allergies as of 11/06/2021   (Not on File)    Family History  Problem Relation Age  of Onset   Breast cancer Neg Hx     Social History   Socioeconomic History   Marital status: Married    Spouse name: Not on file   Number of children: Not on file   Years of education: Not on file   Highest education level: Not on file  Occupational History   Not on file  Tobacco Use   Smoking status: Former    Types: Cigarettes    Quit date: 2000    Years since quitting: 23.6   Smokeless tobacco: Never  Vaping Use   Vaping Use: Never used  Substance and Sexual Activity   Alcohol use: Not Currently   Drug use: Never   Sexual activity: Not on file  Other Topics Concern   Not on file  Social History Narrative   Not on file   Social Determinants of Health   Financial Resource Strain: Not on file  Food Insecurity: Not on file  Transportation Needs: Not on file  Physical Activity: Not on file  Stress: Not on file  Social Connections: Not on file  Intimate Partner Violence: Not on file    Review of Systems: See HPI, otherwise negative ROS  Physical Exam: BP (!) 169/86   Pulse 78   Temp (!) 97.2 F (36.2 C) (Temporal)   Ht '5\' 3"'$  (1.6 m)   Wt 71.4 kg   SpO2 98%  BMI 27.90 kg/m  General:   Alert,  pleasant and cooperative in NAD Head:  Normocephalic and atraumatic. Lungs:  Clear to auscultation.    Heart:  Regular rate and rhythm.   Impression/Plan: Julia Wilson is here for ophthalmic surgery.  Risks, benefits, limitations, and alternatives regarding ophthalmic surgery have been reviewed with the patient.  Questions have been answered.  All parties agreeable.   Leandrew Koyanagi, MD  01/21/2022, 7:31 AM

## 2022-01-21 NOTE — Anesthesia Preprocedure Evaluation (Signed)
Anesthesia Evaluation  Patient identified by MRN, date of birth, ID band Patient awake    Reviewed: Allergy & Precautions, H&P , NPO status , Patient's Chart, lab work & pertinent test results, reviewed documented beta blocker date and time   Airway Mallampati: II  TM Distance: >3 FB Neck ROM: full    Dental no notable dental hx. (+) Teeth Intact   Pulmonary neg pulmonary ROS, former smoker,    Pulmonary exam normal breath sounds clear to auscultation       Cardiovascular Exercise Tolerance: Good hypertension, On Medications negative cardio ROS   Rhythm:regular Rate:Normal     Neuro/Psych negative neurological ROS  negative psych ROS   GI/Hepatic Neg liver ROS, GERD  Medicated,  Endo/Other  diabetes, Well Controlled, Type 2, Oral Hypoglycemic AgentsHypothyroidism   Renal/GU      Musculoskeletal   Abdominal   Peds  Hematology negative hematology ROS (+)   Anesthesia Other Findings   Reproductive/Obstetrics negative OB ROS                             Anesthesia Physical Anesthesia Plan  ASA: 2  Anesthesia Plan: MAC   Post-op Pain Management:    Induction:   PONV Risk Score and Plan:   Airway Management Planned:   Additional Equipment:   Intra-op Plan:   Post-operative Plan:   Informed Consent: I have reviewed the patients History and Physical, chart, labs and discussed the procedure including the risks, benefits and alternatives for the proposed anesthesia with the patient or authorized representative who has indicated his/her understanding and acceptance.       Plan Discussed with: CRNA  Anesthesia Plan Comments:         Anesthesia Quick Evaluation

## 2022-01-21 NOTE — Anesthesia Postprocedure Evaluation (Signed)
Anesthesia Post Note  Patient: Julia Wilson  Procedure(s) Performed: CATARACT EXTRACTION PHACO AND INTRAOCULAR LENS PLACEMENT (IOC) COMPLICATED LEFT 32.35 57:32.2 (Left: Eye) Goniotomy (Left: Eye)     Patient location during evaluation: PACU Anesthesia Type: MAC Level of consciousness: awake and alert Pain management: pain level controlled Vital Signs Assessment: post-procedure vital signs reviewed and stable Respiratory status: spontaneous breathing, nonlabored ventilation, respiratory function stable and patient connected to nasal cannula oxygen Cardiovascular status: blood pressure returned to baseline and stable Postop Assessment: no apparent nausea or vomiting Anesthetic complications: no   No notable events documented.  Molli Barrows

## 2022-01-22 ENCOUNTER — Encounter: Payer: Self-pay | Admitting: Ophthalmology

## 2022-03-26 DIAGNOSIS — E119 Type 2 diabetes mellitus without complications: Secondary | ICD-10-CM | POA: Diagnosis not present

## 2022-03-26 DIAGNOSIS — M48062 Spinal stenosis, lumbar region with neurogenic claudication: Secondary | ICD-10-CM | POA: Diagnosis not present

## 2022-03-26 DIAGNOSIS — E7849 Other hyperlipidemia: Secondary | ICD-10-CM | POA: Diagnosis not present

## 2022-03-26 DIAGNOSIS — K219 Gastro-esophageal reflux disease without esophagitis: Secondary | ICD-10-CM | POA: Diagnosis not present

## 2022-04-02 DIAGNOSIS — E785 Hyperlipidemia, unspecified: Secondary | ICD-10-CM | POA: Diagnosis not present

## 2022-04-02 DIAGNOSIS — E119 Type 2 diabetes mellitus without complications: Secondary | ICD-10-CM | POA: Diagnosis not present

## 2022-04-02 DIAGNOSIS — E039 Hypothyroidism, unspecified: Secondary | ICD-10-CM | POA: Diagnosis not present

## 2022-04-02 DIAGNOSIS — I10 Essential (primary) hypertension: Secondary | ICD-10-CM | POA: Diagnosis not present

## 2022-04-02 DIAGNOSIS — D329 Benign neoplasm of meninges, unspecified: Secondary | ICD-10-CM | POA: Diagnosis not present

## 2022-04-02 DIAGNOSIS — F32A Depression, unspecified: Secondary | ICD-10-CM | POA: Diagnosis not present

## 2022-04-02 DIAGNOSIS — Z0000 Encounter for general adult medical examination without abnormal findings: Secondary | ICD-10-CM | POA: Diagnosis not present

## 2022-04-02 DIAGNOSIS — Z1331 Encounter for screening for depression: Secondary | ICD-10-CM | POA: Diagnosis not present

## 2022-04-02 DIAGNOSIS — Z780 Asymptomatic menopausal state: Secondary | ICD-10-CM | POA: Diagnosis not present

## 2022-04-06 DIAGNOSIS — M8588 Other specified disorders of bone density and structure, other site: Secondary | ICD-10-CM | POA: Diagnosis not present

## 2022-04-14 DIAGNOSIS — H2511 Age-related nuclear cataract, right eye: Secondary | ICD-10-CM | POA: Diagnosis not present

## 2022-04-22 ENCOUNTER — Encounter: Payer: Self-pay | Admitting: Ophthalmology

## 2022-04-28 NOTE — Discharge Instructions (Signed)

## 2022-04-29 ENCOUNTER — Encounter
Admission: RE | Disposition: A | Discharge status: Home / Self Care | Payer: Self-pay | Source: Home / Self Care | Attending: Ophthalmology

## 2022-04-29 ENCOUNTER — Encounter: Payer: Self-pay | Admitting: Ophthalmology

## 2022-04-29 ENCOUNTER — Other Ambulatory Visit: Payer: Self-pay

## 2022-04-29 ENCOUNTER — Ambulatory Visit: Payer: Medicare HMO | Admitting: Anesthesiology

## 2022-04-29 ENCOUNTER — Ambulatory Visit
Admission: RE | Admit: 2022-04-29 | Discharge: 2022-04-29 | Disposition: A | Discharge status: Home / Self Care | Payer: Medicare HMO | Attending: Ophthalmology | Admitting: Ophthalmology

## 2022-04-29 DIAGNOSIS — Z79899 Other long term (current) drug therapy: Secondary | ICD-10-CM | POA: Diagnosis not present

## 2022-04-29 DIAGNOSIS — Z87891 Personal history of nicotine dependence: Secondary | ICD-10-CM | POA: Diagnosis not present

## 2022-04-29 DIAGNOSIS — E1136 Type 2 diabetes mellitus with diabetic cataract: Secondary | ICD-10-CM | POA: Insufficient documentation

## 2022-04-29 DIAGNOSIS — Z79890 Hormone replacement therapy (postmenopausal): Secondary | ICD-10-CM | POA: Diagnosis not present

## 2022-04-29 DIAGNOSIS — E039 Hypothyroidism, unspecified: Secondary | ICD-10-CM | POA: Insufficient documentation

## 2022-04-29 DIAGNOSIS — Z7984 Long term (current) use of oral hypoglycemic drugs: Secondary | ICD-10-CM | POA: Diagnosis not present

## 2022-04-29 DIAGNOSIS — H2511 Age-related nuclear cataract, right eye: Secondary | ICD-10-CM | POA: Diagnosis not present

## 2022-04-29 DIAGNOSIS — K219 Gastro-esophageal reflux disease without esophagitis: Secondary | ICD-10-CM | POA: Insufficient documentation

## 2022-04-29 DIAGNOSIS — I10 Essential (primary) hypertension: Secondary | ICD-10-CM | POA: Diagnosis not present

## 2022-04-29 HISTORY — PX: CATARACT EXTRACTION W/PHACO: SHX586

## 2022-04-29 LAB — GLUCOSE, CAPILLARY: Glucose-Capillary: 101 mg/dL — ABNORMAL HIGH (ref 70–99)

## 2022-04-29 SURGERY — PHACOEMULSIFICATION, CATARACT, WITH IOL INSERTION
Anesthesia: Monitor Anesthesia Care | Site: Eye | Laterality: Right

## 2022-04-29 MED ORDER — LACTATED RINGERS IV SOLN: INTRAVENOUS | Status: DC

## 2022-04-29 MED ORDER — ARMC OPHTHALMIC DILATING DROPS
1.0000 | OPHTHALMIC | Status: DC | PRN
  Administered 2022-04-29 (×3): 1 via OPHTHALMIC

## 2022-04-29 MED ORDER — SIGHTPATH DOSE#1 BSS IO SOLN
INTRAOCULAR | Status: DC | PRN
  Administered 2022-04-29: 2 mL

## 2022-04-29 MED ORDER — MOXIFLOXACIN HCL 0.5 % OP SOLN
OPHTHALMIC | Status: DC | PRN
  Administered 2022-04-29: .2 mL via OPHTHALMIC

## 2022-04-29 MED ORDER — MIDAZOLAM HCL 2 MG/2ML IJ SOLN
INTRAMUSCULAR | Status: DC | PRN
  Administered 2022-04-29: 2 mg via INTRAVENOUS

## 2022-04-29 MED ORDER — FENTANYL CITRATE (PF) 100 MCG/2ML IJ SOLN
INTRAMUSCULAR | Status: DC | PRN
  Administered 2022-04-29: 50 ug via INTRAVENOUS

## 2022-04-29 MED ORDER — SIGHTPATH DOSE#1 BSS IO SOLN
INTRAOCULAR | Status: DC | PRN
  Administered 2022-04-29: 15 mL via INTRAOCULAR

## 2022-04-29 MED ORDER — SIGHTPATH DOSE#1 BSS IO SOLN
INTRAOCULAR | Status: DC | PRN
  Administered 2022-04-29: 84 mL via OPHTHALMIC

## 2022-04-29 MED ORDER — BRIMONIDINE TARTRATE-TIMOLOL 0.2-0.5 % OP SOLN
OPHTHALMIC | Status: DC | PRN
  Administered 2022-04-29: 1 [drp] via OPHTHALMIC

## 2022-04-29 MED ORDER — TETRACAINE HCL 0.5 % OP SOLN
1.0000 [drp] | OPHTHALMIC | Status: DC | PRN
  Administered 2022-04-29 (×3): 1 [drp] via OPHTHALMIC

## 2022-04-29 MED ORDER — SIGHTPATH DOSE#1 NA HYALUR & NA CHOND-NA HYALUR IO KIT
PACK | INTRAOCULAR | Status: DC | PRN
  Administered 2022-04-29: 1 via OPHTHALMIC

## 2022-04-29 SURGICAL SUPPLY — 10 items
CATARACT SUITE SIGHTPATH (MISCELLANEOUS) ×1 IMPLANT
FEE CATARACT SUITE SIGHTPATH (MISCELLANEOUS) ×1 IMPLANT
GLOVE SRG 8 PF TXTR STRL LF DI (GLOVE) ×1 IMPLANT
GLOVE SURG ENC TEXT LTX SZ7.5 (GLOVE) ×1 IMPLANT
GLOVE SURG UNDER POLY LF SZ8 (GLOVE) ×1
LENS IOL TECNIS EYHANCE 24.0 (Intraocular Lens) IMPLANT
NDL FILTER BLUNT 18X1 1/2 (NEEDLE) ×1 IMPLANT
NEEDLE FILTER BLUNT 18X1 1/2 (NEEDLE) ×1 IMPLANT
SYR 3ML LL SCALE MARK (SYRINGE) ×1 IMPLANT
WATER STERILE IRR 250ML POUR (IV SOLUTION) ×1 IMPLANT

## 2022-04-29 NOTE — Transfer of Care (Signed)
Immediate Anesthesia Transfer of Care Note  Patient: Julia Wilson  Procedure(s) Performed: CATARACT EXTRACTION PHACO AND INTRAOCULAR LENS PLACEMENT (IOC) RIGHT 8.84 01:24.9 (Right: Eye)  Patient Location: PACU  Anesthesia Type: MAC  Level of Consciousness: awake, alert  and patient cooperative  Airway and Oxygen Therapy: Patient Spontanous Breathing and Patient connected to supplemental oxygen  Post-op Assessment: Post-op Vital signs reviewed, Patient's Cardiovascular Status Stable, Respiratory Function Stable, Patent Airway and No signs of Nausea or vomiting  Post-op Vital Signs: Reviewed and stable  Complications: There were no known notable events for this encounter.

## 2022-04-29 NOTE — Anesthesia Postprocedure Evaluation (Signed)
Anesthesia Post Note  Patient: Julia Wilson  Procedure(s) Performed: CATARACT EXTRACTION PHACO AND INTRAOCULAR LENS PLACEMENT (IOC) RIGHT 8.84 01:24.9 (Right: Eye)  Patient location during evaluation: PACU Anesthesia Type: MAC Level of consciousness: awake and alert Pain management: pain level controlled Vital Signs Assessment: post-procedure vital signs reviewed and stable Respiratory status: spontaneous breathing, nonlabored ventilation, respiratory function stable and patient connected to nasal cannula oxygen Cardiovascular status: stable and blood pressure returned to baseline Postop Assessment: no apparent nausea or vomiting Anesthetic complications: no   There were no known notable events for this encounter.   Last Vitals:  Vitals:   04/29/22 1135 04/29/22 1141  BP: 136/79 115/72  Pulse: 74 78  Resp: 18 18  Temp: (!) 36.3 C (!) 36.3 C  SpO2: 97% 94%    Last Pain:  Vitals:   04/29/22 1141  TempSrc:   PainSc: 0-No pain                 Arita Miss

## 2022-04-29 NOTE — Op Note (Signed)
  LOCATION:  [place]   PREOPERATIVE DIAGNOSIS:    Nuclear sclerotic cataract right eye. H25.11   POSTOPERATIVE DIAGNOSIS:  Nuclear sclerotic cataract right eye.     PROCEDURE:  Phacoemusification with posterior chamber intraocular lens placement of the right eye   ULTRASOUND TIME: Procedure(s): CATARACT EXTRACTION PHACO AND INTRAOCULAR LENS PLACEMENT (IOC) RIGHT 8.84 01:24.9 (Right)  LENS:   Implant Name Type Inv. Item Serial No. Manufacturer Lot No. LRB No. Used Action  LENS IOL TECNIS EYHANCE 24.0 - [id number] Intraocular Lens LENS IOL TECNIS EYHANCE 24.0 [id number] [employer]  Right 1 Implanted         SURGEON:  Wyonia Hough, MD   ANESTHESIA:  Topical with tetracaine drops and 2% Xylocaine jelly, augmented with 1% preservative-free intracameral lidocaine.    COMPLICATIONS:  None.   DESCRIPTION OF PROCEDURE:  The patient was identified in the holding room and transported to the operating room and placed in the supine position under the operating microscope.  The right eye was identified as the operative eye and it was prepped and draped in the usual sterile ophthalmic fashion.   A 1 millimeter clear-corneal paracentesis was made at the 12:00 position.  0.5 ml of preservative-free 1% lidocaine was injected into the anterior chamber. The anterior chamber was filled with Viscoat viscoelastic.  A 2.4 millimeter keratome was used to make a near-clear corneal incision at the 9:00 position.  A curvilinear capsulorrhexis was made with a cystotome and capsulorrhexis forceps.  Balanced salt solution was used to hydrodissect and hydrodelineate the nucleus.   Phacoemulsification was then used in stop and chop fashion to remove the lens nucleus and epinucleus.  The remaining cortex was then removed using the irrigation and aspiration handpiece. Provisc was then placed into the capsular bag to distend it for lens placement.  A lens was then injected into the capsular bag.   The remaining viscoelastic was aspirated.   Wounds were hydrated with balanced salt solution.  The anterior chamber was inflated to a physiologic pressure with balanced salt solution.  No wound leaks were noted. Vigamox 0.2 ml of a '1mg'$  per ml solution was injected into the anterior chamber for a dose of 0.2 mg of intracameral antibiotic at the completion of the case.   Timolol and Brimonidine drops were applied to the eye.  The patient was taken to the recovery room in stable condition without complications of anesthesia or surgery.   Julia Wilson 04/29/2022, 11:34 AM

## 2022-04-29 NOTE — Anesthesia Preprocedure Evaluation (Signed)
Anesthesia Evaluation  Patient identified by MRN, date of birth, ID band Patient awake    Reviewed: Allergy & Precautions, H&P , NPO status , Patient's Chart, lab work & pertinent test results, reviewed documented beta blocker date and time   History of Anesthesia Complications Negative for: history of anesthetic complications  Airway Mallampati: II  TM Distance: >3 FB Neck ROM: full    Dental no notable dental hx. (+) Teeth Intact   Pulmonary neg pulmonary ROS, neg sleep apnea, neg COPD, Patient abstained from smoking.Not current smoker, former smoker   Pulmonary exam normal breath sounds clear to auscultation       Cardiovascular Exercise Tolerance: Good METShypertension, On Medications (-) CAD and (-) Past MI (-) dysrhythmias  Rhythm:regular Rate:Normal     Neuro/Psych negative neurological ROS  negative psych ROS   GI/Hepatic Neg liver ROS,GERD  Medicated,,  Endo/Other  diabetes, Well Controlled, Type 2, Oral Hypoglycemic AgentsHypothyroidism    Renal/GU negative Renal ROS     Musculoskeletal   Abdominal   Peds  Hematology negative hematology ROS (+)   Anesthesia Other Findings Past Medical History: No date: Cancer ([place])     Comment:  skin No date: GERD (gastroesophageal reflux disease) No date: Hypertension No date: Hypothyroidism No date: Type 2 diabetes mellitus (HCC) No date: Vertigo     Comment:  1 episode, approx 2018  Reproductive/Obstetrics negative OB ROS                             Anesthesia Physical Anesthesia Plan  ASA: 2  Anesthesia Plan: MAC   Post-op Pain Management:    Induction:   PONV Risk Score and Plan: 2 and Treatment may vary due to age or medical condition, TIVA and Midazolam  Airway Management Planned:   Additional Equipment:   Intra-op Plan:   Post-operative Plan:   Informed Consent: I have reviewed the patients History and Physical,  chart, labs and discussed the procedure including the risks, benefits and alternatives for the proposed anesthesia with the patient or authorized representative who has indicated his/her understanding and acceptance.       Plan Discussed with: CRNA  Anesthesia Plan Comments: (Discussed possible difficulty with spontaneous ventilation requiring airway intevention and its associated risks, risks of PONV, rare risks of cardiopulmonary, neurological damage. Patient understands)        Anesthesia Quick Evaluation

## 2022-04-29 NOTE — H&P (Signed)
[facility]   Primary Care Physician:  Adin Hector, MD Ophthalmologist: Dr. Leandrew Koyanagi  Pre-Procedure History & Physical: HPI:  CARDELIA SASSANO is a 81 y.o. female here for ophthalmic surgery.   Past Medical History:  Diagnosis Date   Cancer ([place])    skin   GERD (gastroesophageal reflux disease)    Hypertension    Hypothyroidism    Type 2 diabetes mellitus (HCC)    Vertigo    1 episode, approx 2018    Past Surgical History:  Procedure Laterality Date   CATARACT EXTRACTION W/PHACO Left 01/21/2022   Procedure: CATARACT EXTRACTION PHACO AND INTRAOCULAR LENS PLACEMENT (IOC) COMPLICATED LEFT 85.63 14:97.0;  Surgeon: Leandrew Koyanagi, MD;  Location: [place];  Service: Ophthalmology;  Laterality: Left;   REDUCTION MAMMAPLASTY  1983    Prior to Admission medications   Medication Sig Start Date End Date Taking? Authorizing Provider  cholecalciferol (VITAMIN D3) 25 MCG (1000 UNIT) tablet Take 1,000 Units by mouth daily.   Yes [provider]  dorzolamide (TRUSOPT) 2 % ophthalmic solution 1 drop daily.   Yes [provider]  FLUoxetine (PROZAC) 20 MG tablet Take 20 mg by mouth daily.   Yes [provider]  folic acid (FOLVITE) 263 MCG tablet Take 400 mcg by mouth daily.   Yes [provider]  latanoprost (XALATAN) 0.005 % ophthalmic solution Place 1 drop into both eyes at bedtime.   Yes [provider]  levothyroxine (SYNTHROID) 50 MCG tablet Take 50 mcg by mouth daily before breakfast.   Yes [provider]  losartan-hydrochlorothiazide (HYZAAR) 50-12.5 MG tablet Take 1 tablet by mouth daily.   Yes [provider]  lovastatin (MEVACOR) 40 MG tablet Take 40 mg by mouth at bedtime.   Yes [provider]  metFORMIN (GLUCOPHAGE) 500 MG tablet Take by mouth 2 (two) times daily with a meal.   Yes [provider]  omeprazole (PRILOSEC) 20 MG capsule Take 20 mg by mouth daily.    Yes [provider]  gabapentin (NEURONTIN) 300 MG capsule Take 300 mg by mouth as needed. Patient not taking: Reported on 01/13/2022    [provider]    Allergies as of 03/24/2022 - Review Complete 01/21/2022  Allergen Reaction Noted   Penicillins Swelling and Rash 01/09/2022    Family History  Problem Relation Age of Onset   Breast cancer Neg Hx     Social History   Socioeconomic History   Marital status: Married    Spouse name: Not on file   Number of children: Not on file   Years of education: Not on file   Highest education level: Not on file  Occupational History   Not on file  Tobacco Use   Smoking status: Former    Types: Cigarettes    Quit date: 2000    Years since quitting: 23.8   Smokeless tobacco: Never  Vaping Use   Vaping Use: Never used  Substance and Sexual Activity   Alcohol use: Not Currently   Drug use: Never   Sexual activity: Not on file  Other Topics Concern   Not on file  Social History Narrative   Not on file   Social Determinants of Health   Financial Resource Strain: Not on file  Food Insecurity: Not on file  Transportation Needs: Not on file  Physical Activity: Not on file  Stress: Not on file  Social Connections: Not on file  Intimate Partner Violence: Not on  file    Review of Systems: See HPI, otherwise negative ROS  Physical Exam: BP (!) 148/86   Pulse 79   Temp 98.6 F (37 C) (Temporal)   Resp 18   Ht '5\' 3"'$  (1.6 m)   Wt 72.6 kg   SpO2 98%   BMI 28.34 kg/m  General:   Alert,  pleasant and cooperative in NAD Head:  Normocephalic and atraumatic. Lungs:  Clear to auscultation.    Heart:  Regular rate and rhythm.   Impression/Plan: SIMONNE BOULOS is here for ophthalmic surgery.  Risks, benefits, limitations, and alternatives regarding ophthalmic surgery have been reviewed with the patient.  Questions have been answered.  All parties agreeable.   Leandrew Koyanagi, MD  04/29/2022, 10:17 AM

## 2022-04-30 ENCOUNTER — Encounter: Payer: Self-pay | Admitting: Ophthalmology

## 2022-05-18 DIAGNOSIS — R062 Wheezing: Secondary | ICD-10-CM | POA: Diagnosis not present

## 2022-05-18 DIAGNOSIS — Z8719 Personal history of other diseases of the digestive system: Secondary | ICD-10-CM | POA: Diagnosis not present

## 2022-05-18 DIAGNOSIS — E119 Type 2 diabetes mellitus without complications: Secondary | ICD-10-CM | POA: Diagnosis not present

## 2022-05-18 DIAGNOSIS — R059 Cough, unspecified: Secondary | ICD-10-CM | POA: Diagnosis not present

## 2022-05-18 DIAGNOSIS — I10 Essential (primary) hypertension: Secondary | ICD-10-CM | POA: Diagnosis not present

## 2022-05-26 DIAGNOSIS — Z961 Presence of intraocular lens: Secondary | ICD-10-CM | POA: Diagnosis not present

## 2022-09-01 DIAGNOSIS — R1013 Epigastric pain: Secondary | ICD-10-CM | POA: Diagnosis not present

## 2022-09-01 DIAGNOSIS — E119 Type 2 diabetes mellitus without complications: Secondary | ICD-10-CM | POA: Diagnosis not present

## 2022-09-01 DIAGNOSIS — R079 Chest pain, unspecified: Secondary | ICD-10-CM | POA: Diagnosis not present

## 2022-09-01 DIAGNOSIS — I10 Essential (primary) hypertension: Secondary | ICD-10-CM | POA: Diagnosis not present

## 2022-10-01 DIAGNOSIS — I10 Essential (primary) hypertension: Secondary | ICD-10-CM | POA: Diagnosis not present

## 2022-10-01 DIAGNOSIS — K219 Gastro-esophageal reflux disease without esophagitis: Secondary | ICD-10-CM | POA: Diagnosis not present

## 2022-10-01 DIAGNOSIS — M47816 Spondylosis without myelopathy or radiculopathy, lumbar region: Secondary | ICD-10-CM | POA: Diagnosis not present

## 2022-10-01 DIAGNOSIS — E119 Type 2 diabetes mellitus without complications: Secondary | ICD-10-CM | POA: Diagnosis not present

## 2022-10-01 DIAGNOSIS — E7849 Other hyperlipidemia: Secondary | ICD-10-CM | POA: Diagnosis not present

## 2022-10-08 DIAGNOSIS — E039 Hypothyroidism, unspecified: Secondary | ICD-10-CM | POA: Diagnosis not present

## 2022-10-08 DIAGNOSIS — I10 Essential (primary) hypertension: Secondary | ICD-10-CM | POA: Diagnosis not present

## 2022-10-08 DIAGNOSIS — F32A Depression, unspecified: Secondary | ICD-10-CM | POA: Diagnosis not present

## 2022-10-08 DIAGNOSIS — E119 Type 2 diabetes mellitus without complications: Secondary | ICD-10-CM | POA: Diagnosis not present

## 2022-10-08 DIAGNOSIS — E785 Hyperlipidemia, unspecified: Secondary | ICD-10-CM | POA: Diagnosis not present

## 2022-10-08 DIAGNOSIS — M48062 Spinal stenosis, lumbar region with neurogenic claudication: Secondary | ICD-10-CM | POA: Diagnosis not present

## 2022-11-25 DIAGNOSIS — H401422 Capsular glaucoma with pseudoexfoliation of lens, left eye, moderate stage: Secondary | ICD-10-CM | POA: Diagnosis not present

## 2022-12-17 DIAGNOSIS — H43813 Vitreous degeneration, bilateral: Secondary | ICD-10-CM | POA: Diagnosis not present

## 2022-12-17 DIAGNOSIS — E119 Type 2 diabetes mellitus without complications: Secondary | ICD-10-CM | POA: Diagnosis not present

## 2022-12-17 DIAGNOSIS — H40001 Preglaucoma, unspecified, right eye: Secondary | ICD-10-CM | POA: Diagnosis not present

## 2022-12-17 DIAGNOSIS — H401422 Capsular glaucoma with pseudoexfoliation of lens, left eye, moderate stage: Secondary | ICD-10-CM | POA: Diagnosis not present

## 2023-02-17 DIAGNOSIS — M25541 Pain in joints of right hand: Secondary | ICD-10-CM | POA: Diagnosis not present

## 2023-03-01 DIAGNOSIS — M25511 Pain in right shoulder: Secondary | ICD-10-CM | POA: Diagnosis not present

## 2023-03-01 DIAGNOSIS — G8929 Other chronic pain: Secondary | ICD-10-CM | POA: Diagnosis not present

## 2023-04-02 DIAGNOSIS — H401422 Capsular glaucoma with pseudoexfoliation of lens, left eye, moderate stage: Secondary | ICD-10-CM | POA: Diagnosis not present

## 2023-04-02 DIAGNOSIS — E119 Type 2 diabetes mellitus without complications: Secondary | ICD-10-CM | POA: Diagnosis not present

## 2023-04-02 DIAGNOSIS — Z961 Presence of intraocular lens: Secondary | ICD-10-CM | POA: Diagnosis not present

## 2023-04-02 DIAGNOSIS — E7849 Other hyperlipidemia: Secondary | ICD-10-CM | POA: Diagnosis not present

## 2023-04-02 DIAGNOSIS — E034 Atrophy of thyroid (acquired): Secondary | ICD-10-CM | POA: Diagnosis not present

## 2023-04-02 DIAGNOSIS — Z0100 Encounter for examination of eyes and vision without abnormal findings: Secondary | ICD-10-CM | POA: Diagnosis not present

## 2023-04-02 DIAGNOSIS — H40001 Preglaucoma, unspecified, right eye: Secondary | ICD-10-CM | POA: Diagnosis not present

## 2023-04-02 DIAGNOSIS — M48062 Spinal stenosis, lumbar region with neurogenic claudication: Secondary | ICD-10-CM | POA: Diagnosis not present

## 2023-04-02 DIAGNOSIS — K219 Gastro-esophageal reflux disease without esophagitis: Secondary | ICD-10-CM | POA: Diagnosis not present

## 2023-04-09 DIAGNOSIS — F32A Depression, unspecified: Secondary | ICD-10-CM | POA: Diagnosis not present

## 2023-04-09 DIAGNOSIS — M51369 Other intervertebral disc degeneration, lumbar region without mention of lumbar back pain or lower extremity pain: Secondary | ICD-10-CM | POA: Diagnosis not present

## 2023-04-09 DIAGNOSIS — E039 Hypothyroidism, unspecified: Secondary | ICD-10-CM | POA: Diagnosis not present

## 2023-04-09 DIAGNOSIS — Z1331 Encounter for screening for depression: Secondary | ICD-10-CM | POA: Diagnosis not present

## 2023-04-09 DIAGNOSIS — I10 Essential (primary) hypertension: Secondary | ICD-10-CM | POA: Diagnosis not present

## 2023-04-09 DIAGNOSIS — E785 Hyperlipidemia, unspecified: Secondary | ICD-10-CM | POA: Diagnosis not present

## 2023-04-09 DIAGNOSIS — D329 Benign neoplasm of meninges, unspecified: Secondary | ICD-10-CM | POA: Diagnosis not present

## 2023-04-09 DIAGNOSIS — E119 Type 2 diabetes mellitus without complications: Secondary | ICD-10-CM | POA: Diagnosis not present

## 2023-04-09 DIAGNOSIS — Z0000 Encounter for general adult medical examination without abnormal findings: Secondary | ICD-10-CM | POA: Diagnosis not present

## 2023-05-03 DIAGNOSIS — E7849 Other hyperlipidemia: Secondary | ICD-10-CM | POA: Diagnosis not present

## 2023-10-01 DIAGNOSIS — E034 Atrophy of thyroid (acquired): Secondary | ICD-10-CM | POA: Diagnosis not present

## 2023-10-01 DIAGNOSIS — E118 Type 2 diabetes mellitus with unspecified complications: Secondary | ICD-10-CM | POA: Diagnosis not present

## 2023-10-01 DIAGNOSIS — K219 Gastro-esophageal reflux disease without esophagitis: Secondary | ICD-10-CM | POA: Diagnosis not present

## 2023-10-01 DIAGNOSIS — D329 Benign neoplasm of meninges, unspecified: Secondary | ICD-10-CM | POA: Diagnosis not present

## 2023-10-01 DIAGNOSIS — E7849 Other hyperlipidemia: Secondary | ICD-10-CM | POA: Diagnosis not present

## 2023-10-07 DIAGNOSIS — M48061 Spinal stenosis, lumbar region without neurogenic claudication: Secondary | ICD-10-CM | POA: Diagnosis not present

## 2023-10-07 DIAGNOSIS — E119 Type 2 diabetes mellitus without complications: Secondary | ICD-10-CM | POA: Diagnosis not present

## 2023-10-07 DIAGNOSIS — Z86011 Personal history of benign neoplasm of the brain: Secondary | ICD-10-CM | POA: Diagnosis not present

## 2023-10-07 DIAGNOSIS — F32A Depression, unspecified: Secondary | ICD-10-CM | POA: Diagnosis not present

## 2023-10-07 DIAGNOSIS — I10 Essential (primary) hypertension: Secondary | ICD-10-CM | POA: Diagnosis not present

## 2023-10-07 DIAGNOSIS — E039 Hypothyroidism, unspecified: Secondary | ICD-10-CM | POA: Diagnosis not present

## 2023-10-07 DIAGNOSIS — E782 Mixed hyperlipidemia: Secondary | ICD-10-CM | POA: Diagnosis not present

## 2023-11-04 DIAGNOSIS — M25531 Pain in right wrist: Secondary | ICD-10-CM | POA: Diagnosis not present

## 2023-11-17 DIAGNOSIS — M654 Radial styloid tenosynovitis [de Quervain]: Secondary | ICD-10-CM | POA: Diagnosis not present

## 2023-11-17 DIAGNOSIS — M1811 Unilateral primary osteoarthritis of first carpometacarpal joint, right hand: Secondary | ICD-10-CM | POA: Diagnosis not present

## 2023-12-01 DIAGNOSIS — H40001 Preglaucoma, unspecified, right eye: Secondary | ICD-10-CM | POA: Diagnosis not present

## 2023-12-09 DIAGNOSIS — H40001 Preglaucoma, unspecified, right eye: Secondary | ICD-10-CM | POA: Diagnosis not present

## 2023-12-09 DIAGNOSIS — H43813 Vitreous degeneration, bilateral: Secondary | ICD-10-CM | POA: Diagnosis not present

## 2023-12-09 DIAGNOSIS — H401422 Capsular glaucoma with pseudoexfoliation of lens, left eye, moderate stage: Secondary | ICD-10-CM | POA: Diagnosis not present

## 2023-12-09 DIAGNOSIS — E119 Type 2 diabetes mellitus without complications: Secondary | ICD-10-CM | POA: Diagnosis not present

## 2023-12-09 DIAGNOSIS — Z0100 Encounter for examination of eyes and vision without abnormal findings: Secondary | ICD-10-CM | POA: Diagnosis not present

## 2023-12-22 DIAGNOSIS — M1811 Unilateral primary osteoarthritis of first carpometacarpal joint, right hand: Secondary | ICD-10-CM | POA: Diagnosis not present

## 2023-12-22 DIAGNOSIS — M654 Radial styloid tenosynovitis [de Quervain]: Secondary | ICD-10-CM | POA: Diagnosis not present

## 2023-12-27 DIAGNOSIS — E785 Hyperlipidemia, unspecified: Secondary | ICD-10-CM | POA: Diagnosis not present

## 2023-12-27 DIAGNOSIS — E119 Type 2 diabetes mellitus without complications: Secondary | ICD-10-CM | POA: Diagnosis not present

## 2023-12-27 DIAGNOSIS — I10 Essential (primary) hypertension: Secondary | ICD-10-CM | POA: Diagnosis not present

## 2023-12-27 DIAGNOSIS — E039 Hypothyroidism, unspecified: Secondary | ICD-10-CM | POA: Diagnosis not present

## 2024-03-23 DIAGNOSIS — H6982 Other specified disorders of Eustachian tube, left ear: Secondary | ICD-10-CM | POA: Diagnosis not present

## 2024-03-23 DIAGNOSIS — J301 Allergic rhinitis due to pollen: Secondary | ICD-10-CM | POA: Diagnosis not present

## 2024-03-23 DIAGNOSIS — H6121 Impacted cerumen, right ear: Secondary | ICD-10-CM | POA: Diagnosis not present

## 2024-04-10 DIAGNOSIS — E034 Atrophy of thyroid (acquired): Secondary | ICD-10-CM | POA: Diagnosis not present

## 2024-04-10 DIAGNOSIS — M48062 Spinal stenosis, lumbar region with neurogenic claudication: Secondary | ICD-10-CM | POA: Diagnosis not present

## 2024-04-10 DIAGNOSIS — E118 Type 2 diabetes mellitus with unspecified complications: Secondary | ICD-10-CM | POA: Diagnosis not present

## 2024-04-10 DIAGNOSIS — K219 Gastro-esophageal reflux disease without esophagitis: Secondary | ICD-10-CM | POA: Diagnosis not present

## 2024-04-10 DIAGNOSIS — I10 Essential (primary) hypertension: Secondary | ICD-10-CM | POA: Diagnosis not present

## 2024-04-10 DIAGNOSIS — E7849 Other hyperlipidemia: Secondary | ICD-10-CM | POA: Diagnosis not present

## 2024-04-17 DIAGNOSIS — E119 Type 2 diabetes mellitus without complications: Secondary | ICD-10-CM | POA: Diagnosis not present

## 2024-04-17 DIAGNOSIS — E785 Hyperlipidemia, unspecified: Secondary | ICD-10-CM | POA: Diagnosis not present

## 2024-04-17 DIAGNOSIS — Z1331 Encounter for screening for depression: Secondary | ICD-10-CM | POA: Diagnosis not present

## 2024-04-17 DIAGNOSIS — E034 Atrophy of thyroid (acquired): Secondary | ICD-10-CM | POA: Diagnosis not present

## 2024-04-17 DIAGNOSIS — M4726 Other spondylosis with radiculopathy, lumbar region: Secondary | ICD-10-CM | POA: Diagnosis not present

## 2024-04-17 DIAGNOSIS — Z0000 Encounter for general adult medical examination without abnormal findings: Secondary | ICD-10-CM | POA: Diagnosis not present

## 2024-04-17 DIAGNOSIS — I10 Essential (primary) hypertension: Secondary | ICD-10-CM | POA: Diagnosis not present

## 2024-04-17 DIAGNOSIS — E118 Type 2 diabetes mellitus with unspecified complications: Secondary | ICD-10-CM | POA: Diagnosis not present

## 2024-04-17 DIAGNOSIS — Z23 Encounter for immunization: Secondary | ICD-10-CM | POA: Diagnosis not present
